# Patient Record
Sex: Female | Born: 1995 | Race: Black or African American | Hispanic: No | Marital: Single | Smoking: Never smoker
Health system: Southern US, Community
[De-identification: ages and names within clinical notes are randomized; demographics above are authoritative.]

## PROBLEM LIST (undated history)

## (undated) DIAGNOSIS — Z87442 Personal history of urinary calculi: Secondary | ICD-10-CM

## (undated) DIAGNOSIS — R102 Pelvic and perineal pain: Secondary | ICD-10-CM

## (undated) DIAGNOSIS — Z8489 Family history of other specified conditions: Secondary | ICD-10-CM

## (undated) DIAGNOSIS — N200 Calculus of kidney: Secondary | ICD-10-CM

---
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
## 2015-01-20 ENCOUNTER — Emergency Department (HOSPITAL_COMMUNITY): Payer: BLUE CROSS/BLUE SHIELD

## 2015-01-20 ENCOUNTER — Encounter (HOSPITAL_COMMUNITY): Payer: Self-pay | Admitting: Emergency Medicine

## 2015-01-20 ENCOUNTER — Ambulatory Visit: Payer: Self-pay | Admitting: Physician Assistant

## 2015-01-20 ENCOUNTER — Emergency Department (HOSPITAL_COMMUNITY)
Admission: EM | Admit: 2015-01-20 | Discharge: 2015-01-20 | Disposition: A | Discharge status: Home / Self Care | Payer: BLUE CROSS/BLUE SHIELD | Attending: Emergency Medicine | Admitting: Emergency Medicine

## 2015-01-20 DIAGNOSIS — N390 Urinary tract infection, site not specified: Secondary | ICD-10-CM | POA: Diagnosis not present

## 2015-01-20 DIAGNOSIS — Z3202 Encounter for pregnancy test, result negative: Secondary | ICD-10-CM | POA: Insufficient documentation

## 2015-01-20 DIAGNOSIS — Z79899 Other long term (current) drug therapy: Secondary | ICD-10-CM | POA: Insufficient documentation

## 2015-01-20 DIAGNOSIS — Z792 Long term (current) use of antibiotics: Secondary | ICD-10-CM | POA: Insufficient documentation

## 2015-01-20 DIAGNOSIS — Z880 Allergy status to penicillin: Secondary | ICD-10-CM | POA: Insufficient documentation

## 2015-01-20 DIAGNOSIS — R1012 Left upper quadrant pain: Secondary | ICD-10-CM | POA: Diagnosis present

## 2015-01-20 DIAGNOSIS — Z8742 Personal history of other diseases of the female genital tract: Secondary | ICD-10-CM | POA: Diagnosis not present

## 2015-01-20 LAB — URINALYSIS, ROUTINE W REFLEX MICROSCOPIC
Bilirubin Urine: NEGATIVE
GLUCOSE, UA: NEGATIVE mg/dL
Hgb urine dipstick: NEGATIVE
Ketones, ur: NEGATIVE mg/dL
Nitrite: POSITIVE — AB
PH: 6.5 (ref 5.0–8.0)
Protein, ur: NEGATIVE mg/dL
Specific Gravity, Urine: 1.024 (ref 1.005–1.030)

## 2015-01-20 LAB — CBC WITH DIFFERENTIAL/PLATELET
BASOS ABS: 0 10*3/uL (ref 0.0–0.1)
Basophils Relative: 0 %
EOS PCT: 1 %
Eosinophils Absolute: 0.1 10*3/uL (ref 0.0–0.7)
HCT: 38.6 % (ref 36.0–46.0)
Hemoglobin: 13 g/dL (ref 12.0–15.0)
LYMPHS PCT: 30 %
Lymphs Abs: 2.3 10*3/uL (ref 0.7–4.0)
MCH: 31.2 pg (ref 26.0–34.0)
MCHC: 33.7 g/dL (ref 30.0–36.0)
MCV: 92.6 fL (ref 78.0–100.0)
MONO ABS: 0.7 10*3/uL (ref 0.1–1.0)
MONOS PCT: 9 %
Neutro Abs: 4.5 10*3/uL (ref 1.7–7.7)
Neutrophils Relative %: 60 %
PLATELETS: 222 10*3/uL (ref 150–400)
RBC: 4.17 MIL/uL (ref 3.87–5.11)
RDW: 12 % (ref 11.5–15.5)
WBC: 7.5 10*3/uL (ref 4.0–10.5)

## 2015-01-20 LAB — COMPREHENSIVE METABOLIC PANEL
ALBUMIN: 4.1 g/dL (ref 3.5–5.0)
ALK PHOS: 62 U/L (ref 38–126)
ALT: 14 U/L (ref 14–54)
AST: 19 U/L (ref 15–41)
Anion gap: 7 (ref 5–15)
BILIRUBIN TOTAL: 0.5 mg/dL (ref 0.3–1.2)
BUN: 8 mg/dL (ref 6–20)
CALCIUM: 9.8 mg/dL (ref 8.9–10.3)
CO2: 26 mmol/L (ref 22–32)
Chloride: 104 mmol/L (ref 101–111)
Creatinine, Ser: 0.76 mg/dL (ref 0.44–1.00)
GFR calc Af Amer: >60 mL/min (ref >60)
GFR calc non Af Amer: >60 mL/min (ref >60)
GLUCOSE: 85 mg/dL (ref 65–99)
POTASSIUM: 4.4 mmol/L (ref 3.5–5.1)
Sodium: 137 mmol/L (ref 135–145)
TOTAL PROTEIN: 7.9 g/dL (ref 6.5–8.1)

## 2015-01-20 LAB — URINE MICROSCOPIC-ADD ON: RBC / HPF: NONE SEEN RBC/hpf (ref 0–5)

## 2015-01-20 LAB — PREGNANCY, URINE: Preg Test, Ur: NEGATIVE

## 2015-01-20 LAB — LIPASE, BLOOD: Lipase: 26 U/L (ref 11–51)

## 2015-01-20 MED ORDER — FLUCONAZOLE 200 MG PO TABS: 200.0000 mg | ORAL_TABLET | Freq: Every day | ORAL | Status: DC

## 2015-01-20 MED ORDER — CIPROFLOXACIN HCL 500 MG PO TABS: 500.0000 mg | ORAL_TABLET | Freq: Two times a day (BID) | ORAL | Status: DC

## 2015-01-20 NOTE — ED Notes (Signed)
PT DISCHARGED. INSTRUCTIONS AND PRESCRIPTION GIVEN. AAOX3. PT IN NO APPARENT DISTRESS. THE OPPORTUNITY TO ASK QUESTIONS WAS PROVIDED. 

## 2015-01-20 NOTE — Discharge Instructions (Signed)

## 2015-01-20 NOTE — ED Notes (Addendum)
Pt reports intermittent left lower abdominal pain and left lower flank pain intermittently for 1.5 months. Says she noticed her urine was cloudy today. Was recently diagnosed with bacterial vaginosis at her [facility] and is currently taking antibiotics for this. Also had urine tested this same visit and was told she did not have a UTI. Denies dysuria/burning with urination/N/V/D/Fevers. Taking Flagyl currently. No other c/c.

## 2015-01-20 NOTE — ED Notes (Signed)
INITIAL ASSESSMENT COMPLETED. PT C/O LL BACK PAIN RADIATING TO LLQ X1 MONTH. PT DENIES URINARY SYMPTOMS. PT STATES SHE WAS SEEN BY HER PMD, AND AND WAS TOLD HER LEFT OVARY TENDERNESS.  AWAITING FURTHER ORDERS.

## 2015-01-20 NOTE — ED Provider Notes (Signed)
CSN: [id number]     Arrival date & time 01/20/15  1423 History   First MD Initiated Contact with Patient 01/20/15 1543     Chief Complaint  Patient presents with  . Flank Pain  . Abdominal Pain  . Cloudy urine      (Consider location/radiation/quality/duration/timing/severity/associated sxs/prior Treatment) HPI Comments: Patient presents with abdominal pain. She states over last 1-2 months she's had intermittent pain in her left upper abdomen. Occasionally it's on her right upper abdomen. She has no associated nausea or vomiting. She denies any other radicular symptoms. Occasionally it's in her back but it doesn't seem to radiate around to her back. She denies any lower abdominal pain. She denies any urinary symptoms. There is no vaginal bleeding or discharge although she was recently treated for bacterial vaginosis with Flagyl. She denies any change in bowel habits although she is occasionally constipated. She denies any fevers or chills. She's not been taking anything for the symptoms.  Patient is a 19 y.o. female presenting with flank pain and abdominal pain.  Flank Pain Associated symptoms include abdominal pain. Pertinent negatives include no chest pain, no headaches and no shortness of breath.  Abdominal Pain Associated symptoms: no chest pain, no chills, no cough, no diarrhea, no fatigue, no fever, no hematuria, no nausea, no shortness of breath and no vomiting     Past Medical History  Diagnosis Date  . Bacterial vaginosis    History reviewed. No pertinent past surgical history. History reviewed. No pertinent family history. Social History  Substance Use Topics  . Smoking status: Never Smoker   . Smokeless tobacco: None  . Alcohol Use: No   OB History    No data available     Review of Systems  Constitutional: Negative for fever, chills, diaphoresis and fatigue.  HENT: Negative for congestion, rhinorrhea and sneezing.   Eyes: Negative.   Respiratory: Negative for  cough, chest tightness and shortness of breath.   Cardiovascular: Negative for chest pain and leg swelling.  Gastrointestinal: Positive for abdominal pain. Negative for nausea, vomiting, diarrhea and blood in stool.  Genitourinary: Negative for frequency, hematuria, flank pain and difficulty urinating.  Musculoskeletal: Negative for back pain and arthralgias.  Skin: Negative for rash.  Neurological: Negative for dizziness, speech difficulty, weakness, numbness and headaches.      Allergies  Penicillins  Home Medications   Prior to Admission medications   Medication Sig Start Date End Date Taking? Authorizing Provider  ciprofloxacin (CIPRO) 500 MG tablet Take 1 tablet (500 mg total) by mouth 2 (two) times daily. One po bid x 7 days 01/20/15   Rolan Bucco, MD  metroNIDAZOLE (FLAGYL) 500 MG tablet Take 500 mg by mouth 2 (two) times daily.   Yes Historical Provider, MD  Multiple Vitamin (MULTIVITAMIN WITH MINERALS) TABS tablet Take 1 tablet by mouth daily.   Yes Historical Provider, MD  Pseudoeph-Doxylamine-DM-APAP (DAYQUIL/NYQUIL COLD/FLU RELIEF PO) Take 1 capsule by mouth daily as needed (cold symptoms).   Yes Historical Provider, MD   BP 107/68 mmHg  Pulse 77  Temp(Src) 97.4 F (36.3 C) (Oral)  Resp 18  Ht  (1.473 m)  Wt 126 lb (57.153 kg)  BMI 26.34 kg/m2  SpO2 98% Physical Exam  Constitutional: She is oriented to person, place, and time. She appears well-developed and well-nourished.  HENT:  Head: Normocephalic and atraumatic.  Eyes: Pupils are equal, round, and reactive to light.  Neck: Normal range of motion. Neck supple.  Cardiovascular: Normal rate, regular rhythm  and normal heart sounds.   Pulmonary/Chest: Effort normal and breath sounds normal. No respiratory distress. She has no wheezes. She has no rales. She exhibits no tenderness.  Abdominal: Soft. Bowel sounds are normal. There is tenderness (Mild tenderness to left upper quadrant). There is no rebound and no  guarding.  Musculoskeletal: Normal range of motion. She exhibits no edema.  Lymphadenopathy:    She has no cervical adenopathy.  Neurological: She is alert and oriented to person, place, and time.  Skin: Skin is warm and dry. No rash noted.  Psychiatric: She has a normal mood and affect.    ED Course  Procedures (including critical care time) Labs Review Results for orders placed or performed during the hospital encounter of 01/20/15  Urinalysis, Routine w reflex microscopic  Result Value Ref Range   Color, Urine RED (A) YELLOW   APPearance CLOUDY (A) CLEAR   Specific Gravity, Urine 1.024 1.005 - 1.030   pH 6.5 5.0 - 8.0   Glucose, UA NEGATIVE NEGATIVE mg/dL   Hgb urine dipstick NEGATIVE NEGATIVE   Bilirubin Urine NEGATIVE NEGATIVE   Ketones, ur NEGATIVE NEGATIVE mg/dL   Protein, ur NEGATIVE NEGATIVE mg/dL   Nitrite POSITIVE (A) NEGATIVE   Leukocytes, UA MODERATE (A) NEGATIVE  Pregnancy, urine  Result Value Ref Range   Preg Test, Ur NEGATIVE NEGATIVE  Comprehensive metabolic panel  Result Value Ref Range   Sodium 137 135 - 145 mmol/L   Potassium 4.4 3.5 - 5.1 mmol/L   Chloride 104 101 - 111 mmol/L   CO2 26 22 - 32 mmol/L   Glucose, Bld 85 65 - 99 mg/dL   BUN 8 6 - 20 mg/dL   Creatinine, Ser 1.61 0.44 - 1.00 mg/dL   Calcium 9.8 8.9 - 09.6 mg/dL   Total Protein 7.9 6.5 - 8.1 g/dL   Albumin 4.1 3.5 - 5.0 g/dL   AST 19 15 - 41 U/L   ALT 14 14 - 54 U/L   Alkaline Phosphatase 62 38 - 126 U/L   Total Bilirubin 0.5 0.3 - 1.2 mg/dL   GFR calc non Af Amer >60 >60 mL/min   GFR calc Af Amer >60 >60 mL/min   Anion gap 7 5 - 15  CBC with Differential  Result Value Ref Range   WBC 7.5 4.0 - 10.5 K/uL   RBC 4.17 3.87 - 5.11 MIL/uL   Hemoglobin 13.0 12.0 - 15.0 g/dL   HCT 04.5 40.9 - 81.1 %   MCV 92.6 78.0 - 100.0 fL   MCH 31.2 26.0 - 34.0 pg   MCHC 33.7 30.0 - 36.0 g/dL   RDW 91.4 78.2 - 95.6 %   Platelets 222 150 - 400 K/uL   Neutrophils Relative % 60 %   Neutro Abs 4.5  1.7 - 7.7 K/uL   Lymphocytes Relative 30 %   Lymphs Abs 2.3 0.7 - 4.0 K/uL   Monocytes Relative 9 %   Monocytes Absolute 0.7 0.1 - 1.0 K/uL   Eosinophils Relative 1 %   Eosinophils Absolute 0.1 0.0 - 0.7 K/uL   Basophils Relative 0 %   Basophils Absolute 0.0 0.0 - 0.1 K/uL  Lipase, blood  Result Value Ref Range   Lipase 26 11 - 51 U/L  Urine microscopic-add on  Result Value Ref Range   Squamous Epithelial / LPF 6-30 (A) NONE SEEN   WBC, UA 6-30 0 - 5 WBC/hpf   RBC / HPF NONE SEEN 0 - 5 RBC/hpf   Bacteria, UA  FEW (A) NONE SEEN   Urine-Other MUCOUS PRESENT    Dg Abd Acute W/chest  01/20/2015  CLINICAL DATA:  Left abdominal and flank pain for 1.5 months. Bacterial vaginosis. EXAM: DG ABDOMEN ACUTE W/ 1V CHEST COMPARISON:  None. FINDINGS: There is no evidence of dilated bowel loops or free intraperitoneal air. No radiopaque calculi identified. Moderate colonic stool noted. Heart size and mediastinal contours are within normal limits. Both lungs are clear. No evidence of pneumothorax or pleural effusion. IMPRESSION: No acute findings.  Moderate stool noted. No active cardiopulmonary disease. Electronically Signed   By: Myles Rosenthal M.D.   On: 01/20/2015 17:35   Ct Renal Stone Study  01/20/2015  CLINICAL DATA:  Left lower quadrant pain and left flank pain for 6 weeks. EXAM: CT ABDOMEN AND PELVIS WITHOUT CONTRAST TECHNIQUE: Multidetector CT imaging of the abdomen and pelvis was performed following the standard protocol without IV contrast. COMPARISON:  None. FINDINGS: Lower chest:  No acute findings. Hepatobiliary: No mass visualized on this un-enhanced exam. Gallbladder is unremarkable. Pancreas: No mass or inflammatory process identified on this un-enhanced exam. Spleen: Within normal limits in size. Adrenals/Urinary Tract: A 3 mm nonobstructive calculus seen in midpole of right kidney. No evidence of hydronephrosis. No evidence of ureteral calculi or dilatation. No bladder calculi visualized.  Stomach/Bowel: No evidence of obstruction, inflammatory process, or abnormal fluid collections. Normal appendix visualized. Vascular/Lymphatic: No pathologically enlarged lymph nodes. No evidence of abdominal aortic aneurysm. Reproductive: No mass or other significant abnormality. Other: None. Musculoskeletal:  No suspicious bone lesions identified. IMPRESSION: 3 mm nonobstructive right renal calculus. No evidence of ureteral calculi, hydronephrosis, or other acute findings. Electronically Signed   By: Myles Rosenthal M.D.   On: 01/20/2015 19:21      Imaging Review Dg Abd Acute W/chest  01/20/2015  CLINICAL DATA:  Left abdominal and flank pain for 1.5 months. Bacterial vaginosis. EXAM: DG ABDOMEN ACUTE W/ 1V CHEST COMPARISON:  None. FINDINGS: There is no evidence of dilated bowel loops or free intraperitoneal air. No radiopaque calculi identified. Moderate colonic stool noted. Heart size and mediastinal contours are within normal limits. Both lungs are clear. No evidence of pneumothorax or pleural effusion. IMPRESSION: No acute findings.  Moderate stool noted. No active cardiopulmonary disease. Electronically Signed   By: Myles Rosenthal M.D.   On: 01/20/2015 17:35   Ct Renal Stone Study  01/20/2015  CLINICAL DATA:  Left lower quadrant pain and left flank pain for 6 weeks. EXAM: CT ABDOMEN AND PELVIS WITHOUT CONTRAST TECHNIQUE: Multidetector CT imaging of the abdomen and pelvis was performed following the standard protocol without IV contrast. COMPARISON:  None. FINDINGS: Lower chest:  No acute findings. Hepatobiliary: No mass visualized on this un-enhanced exam. Gallbladder is unremarkable. Pancreas: No mass or inflammatory process identified on this un-enhanced exam. Spleen: Within normal limits in size. Adrenals/Urinary Tract: A 3 mm nonobstructive calculus seen in midpole of right kidney. No evidence of hydronephrosis. No evidence of ureteral calculi or dilatation. No bladder calculi visualized. Stomach/Bowel:  No evidence of obstruction, inflammatory process, or abnormal fluid collections. Normal appendix visualized. Vascular/Lymphatic: No pathologically enlarged lymph nodes. No evidence of abdominal aortic aneurysm. Reproductive: No mass or other significant abnormality. Other: None. Musculoskeletal:  No suspicious bone lesions identified. IMPRESSION: 3 mm nonobstructive right renal calculus. No evidence of ureteral calculi, hydronephrosis, or other acute findings. Electronically Signed   By: Myles Rosenthal M.D.   On: 01/20/2015 19:21   I have personally reviewed and evaluated these images and  lab results as part of my medical decision-making.   EKG Interpretation None      MDM   Final diagnoses:  UTI (lower urinary tract infection)    Patient presents with left-sided abdominal pain. It's more in the upper abdomen. There is no pelvic tenderness. She had a recent pelvic exams-1 was indicated today given the location of her symptoms. She has evidence of a UTI. There was some hematuria so I did do a CT scan to rule out an obstructing stone. There is no evidence of an obstructing kidney stone. No other significant abnormality was noted. Patient is nontoxic appearing. There is no signs of systemic illness. She was discharged home in good condition with prescriptions for Cipro. She was given return precautions. She was also advised to use MiraLAX at home for the constipation.    Rolan BuccoMelanie Nailah Luepke, MD 01/20/15 2000

## 2015-01-22 LAB — URINE CULTURE: Special Requests: NORMAL

## 2015-02-28 ENCOUNTER — Encounter: Payer: Self-pay | Admitting: Obstetrics and Gynecology

## 2015-02-28 ENCOUNTER — Ambulatory Visit (INDEPENDENT_AMBULATORY_CARE_PROVIDER_SITE_OTHER): Payer: BLUE CROSS/BLUE SHIELD | Admitting: Obstetrics and Gynecology

## 2015-02-28 VITALS — BP 111/66 | HR 80 | Temp 99.3°F | Wt 122.0 lb

## 2015-02-28 DIAGNOSIS — N390 Urinary tract infection, site not specified: Secondary | ICD-10-CM

## 2015-02-28 NOTE — Patient Instructions (Signed)
Contraception Choices Contraception (birth control) is the use of any methods or devices to prevent pregnancy. Below are some methods to help avoid pregnancy. HORMONAL METHODS   Contraceptive implant. This is a thin, plastic tube containing progesterone hormone. It does not contain estrogen hormone. Your health care provider inserts the tube in the inner part of the upper arm. The tube can remain in place for up to 3 years. After 3 years, the implant must be removed. The implant prevents the ovaries from releasing an egg (ovulation), thickens the cervical mucus to prevent sperm from entering the uterus, and thins the lining of the inside of the uterus.  Progesterone-only injections. These injections are given every 3 months by your health care provider to prevent pregnancy. This synthetic progesterone hormone stops the ovaries from releasing eggs. It also thickens cervical mucus and changes the uterine lining. This makes it harder for sperm to survive in the uterus.  Birth control pills. These pills contain estrogen and progesterone hormone. They work by preventing the ovaries from releasing eggs (ovulation). They also cause the cervical mucus to thicken, preventing the sperm from entering the uterus. Birth control pills are prescribed by a health care provider.Birth control pills can also be used to treat heavy periods.  Minipill. This type of birth control pill contains only the progesterone hormone. They are taken every day of each month and must be prescribed by your health care provider.  Birth control patch. The patch contains hormones similar to those in birth control pills. It must be changed once a week and is prescribed by a health care provider.  Vaginal ring. The ring contains hormones similar to those in birth control pills. It is left in the vagina for 3 weeks, removed for 1 week, and then a new one is put back in place. The patient must be comfortable inserting and removing the ring  from the vagina.A health care provider's prescription is necessary.  Emergency contraception. Emergency contraceptives prevent pregnancy after unprotected sexual intercourse. This pill can be taken right after sex or up to 5 days after unprotected sex. It is most effective the sooner you take the pills after having sexual intercourse. Most emergency contraceptive pills are available without a prescription. Check with your pharmacist. Do not use emergency contraception as your only form of birth control. BARRIER METHODS   Female condom. This is a thin sheath (latex or rubber) that is worn over the penis during sexual intercourse. It can be used with spermicide to increase effectiveness.  Female condom. This is a soft, loose-fitting sheath that is put into the vagina before sexual intercourse.  Diaphragm. This is a soft, latex, dome-shaped barrier that must be fitted by a health care provider. It is inserted into the vagina, along with a spermicidal jelly. It is inserted before intercourse. The diaphragm should be left in the vagina for 6 to 8 hours after intercourse.  Cervical cap. This is a round, soft, latex or plastic cup that fits over the cervix and must be fitted by a health care provider. The cap can be left in place for up to 48 hours after intercourse.  Sponge. This is a soft, circular piece of polyurethane foam. The sponge has spermicide in it. It is inserted into the vagina after wetting it and before sexual intercourse.  Spermicides. These are chemicals that kill or block sperm from entering the cervix and uterus. They come in the form of creams, jellies, suppositories, foam, or tablets. They do not require a   prescription. They are inserted into the vagina with an applicator before having sexual intercourse. The process must be repeated every time you have sexual intercourse. INTRAUTERINE CONTRACEPTION  Intrauterine device (IUD). This is a T-shaped device that is put in a woman's uterus  during a menstrual period to prevent pregnancy. There are 2 types:  Copper IUD. This type of IUD is wrapped in copper wire and is placed inside the uterus. Copper makes the uterus and fallopian tubes produce a fluid that kills sperm. It can stay in place for 10 years.  Hormone IUD. This type of IUD contains the hormone progestin (synthetic progesterone). The hormone thickens the cervical mucus and prevents sperm from entering the uterus, and it also thins the uterine lining to prevent implantation of a fertilized egg. The hormone can weaken or kill the sperm that get into the uterus. It can stay in place for 3-5 years, depending on which type of IUD is used. PERMANENT METHODS OF CONTRACEPTION  Female tubal ligation. This is when the woman's fallopian tubes are surgically sealed, tied, or blocked to prevent the egg from traveling to the uterus.  Hysteroscopic sterilization. This involves placing a small coil or insert into each fallopian tube. Your doctor uses a technique called hysteroscopy to do the procedure. The device causes scar tissue to form. This results in permanent blockage of the fallopian tubes, so the sperm cannot fertilize the egg. It takes about 3 months after the procedure for the tubes to become blocked. You must use another form of birth control for these 3 months.  Female sterilization. This is when the female has the tubes that carry sperm tied off (vasectomy).This blocks sperm from entering the vagina during sexual intercourse. After the procedure, the man can still ejaculate fluid (semen). NATURAL PLANNING METHODS  Natural family planning. This is not having sexual intercourse or using a barrier method (condom, diaphragm, cervical cap) on days the woman could become pregnant.  Calendar method. This is keeping track of the length of each menstrual cycle and identifying when you are fertile.  Ovulation method. This is avoiding sexual intercourse during ovulation.  Symptothermal  method. This is avoiding sexual intercourse during ovulation, using a thermometer and ovulation symptoms.  Post-ovulation method. This is timing sexual intercourse after you have ovulated. Regardless of which type or method of contraception you choose, it is important that you use condoms to protect against the transmission of sexually transmitted infections (STIs). Talk with your health care provider about which form of contraception is most appropriate for you.   This information is not intended to replace advice given to you by your health care provider. Make sure you discuss any questions you have with your health care provider.   Document Released: 02/01/2005 Document Revised: 02/06/2013 Document Reviewed: 07/27/2012 Elsevier Interactive Patient Education 2016 [employer]  

## 2015-02-28 NOTE — Progress Notes (Signed)
Patient ID: Sheri Holden, female   DOB: 1996-01-07, 20 y.o.   MRN: [id number] 20 yo G0 presenting today as an ED follow up for abdominal pain. Patient was diagnosed with a UTI and kidney stone. She reports that her abdominal pain is still present and seems to be aggravated with bowel movements. She denies any abnormal vaginal bleeding or discharge. Her pain is located in the left upper and mid quadrant. Patient does report some constipation. She is not currently sexually active but is using depo-provera for contraception  Past Medical History  Diagnosis Date  . Bacterial vaginosis    No past surgical history on file. No family history on file. Social History  Substance Use Topics  . Smoking status: Never Smoker   . Smokeless tobacco: None  . Alcohol Use: No   ROS See pertinent in HPI  Blood pressure 111/66, pulse 80, temperature 99.3 F (37.4 C), weight 122 lb (55.339 kg). GENERAL: Well-developed, well-nourished female in no acute distress.  ABDOMEN: Soft, nondistended, tenderness in LUQ and left periumbilical region. No rebound, no guarding. PELVIC: Not indicated EXTREMITIES: No cyanosis, clubbing, or edema, 2+ distal pulses.  EXAM: CT ABDOMEN AND PELVIS WITHOUT CONTRAST  TECHNIQUE: Multidetector CT imaging of the abdomen and pelvis was performed following the standard protocol without IV contrast.  COMPARISON: None.  FINDINGS: Lower chest: No acute findings.  Hepatobiliary: No mass visualized on this un-enhanced exam. Gallbladder is unremarkable.  Pancreas: No mass or inflammatory process identified on this un-enhanced exam.  Spleen: Within normal limits in size.  Adrenals/Urinary Tract: A 3 mm nonobstructive calculus seen in midpole of right kidney. No evidence of hydronephrosis. No evidence of ureteral calculi or dilatation. No bladder calculi visualized.  Stomach/Bowel: No evidence of obstruction, inflammatory process, or abnormal fluid collections.  Normal appendix visualized.  Vascular/Lymphatic: No pathologically enlarged lymph nodes. No evidence of abdominal aortic aneurysm.  Reproductive: No mass or other significant abnormality.  Other: None.  Musculoskeletal: No suspicious bone lesions identified.  IMPRESSION: 3 mm nonobstructive right renal calculus. No evidence of ureteral calculi, hydronephrosis, or other acute findings.   Electronically Signed  By: Myles RosenthalJohn Stahl M.D.  On: 01/20/2015 19:21  A/P 20 yo with abdominal pain - Pain likely related to constipation.  - Advised to increase her water and fiber intake (either through diet or supplements) - Recommended starting on probiotics - If abdominal pain persists, she should follow up with a PCP

## 2015-03-01 LAB — URINE CULTURE
COLONY COUNT: NO GROWTH
Organism ID, Bacteria: NO GROWTH

## 2015-05-09 ENCOUNTER — Inpatient Hospital Stay (HOSPITAL_COMMUNITY)
Admission: EM | Admit: 2015-05-09 | Discharge: 2015-05-10 | Disposition: A | Discharge status: Home / Self Care | Payer: BLUE CROSS/BLUE SHIELD | Source: Ambulatory Visit | Attending: Obstetrics & Gynecology | Admitting: Obstetrics & Gynecology

## 2015-05-09 ENCOUNTER — Encounter (HOSPITAL_COMMUNITY): Payer: Self-pay | Admitting: *Deleted

## 2015-05-09 DIAGNOSIS — Z711 Person with feared health complaint in whom no diagnosis is made: Secondary | ICD-10-CM

## 2015-05-09 DIAGNOSIS — Z87442 Personal history of urinary calculi: Secondary | ICD-10-CM | POA: Insufficient documentation

## 2015-05-09 DIAGNOSIS — Z880 Allergy status to penicillin: Secondary | ICD-10-CM | POA: Insufficient documentation

## 2015-05-09 DIAGNOSIS — G8929 Other chronic pain: Secondary | ICD-10-CM

## 2015-05-09 DIAGNOSIS — Z3202 Encounter for pregnancy test, result negative: Secondary | ICD-10-CM | POA: Insufficient documentation

## 2015-05-09 DIAGNOSIS — R1032 Left lower quadrant pain: Secondary | ICD-10-CM | POA: Insufficient documentation

## 2015-05-09 DIAGNOSIS — K5900 Constipation, unspecified: Secondary | ICD-10-CM

## 2015-05-09 LAB — POCT PREGNANCY, URINE: Preg Test, Ur: NEGATIVE

## 2015-05-09 NOTE — MAU Note (Signed)
Having chest pain/pressure mid chest for month and half. Lower back pain and pain LLQ for about a month. Denies irreg vag bleeding or d/c

## 2015-05-09 NOTE — MAU Provider Note (Signed)
History     CSN: [id number]  Arrival date and time: 05/09/15 2257   First Provider Initiated Contact with Patient 05/09/15 2330      Chief Complaint  Patient presents with  . Chest Pain  . Abdominal Pain  . Back Pain   HPI   Ms.Sheri Holden is a 20 y.o. female with a history of constipation G0P0000 presenting to MAU with LLQ pain that started 1.5 month ago. I haven't had time in my schedule to be seen for this. "I have pains all over my body, the pains have been there for over a month". "The pains are in my chest, my abdomen and my lower back".   She voices concern that she may be dehydrated and may need IV fluids today.    She had a BM yesterday, unsure prior to that when the last stool was. She is not taking anything at home for the constipation. This was discussed at her last visit in the [facility].   She has occasional chest pain that comes randomly; when it comes it feels like pressure. When she sits down the pain goes away. She denies pain at this time. No one in her family has heart problems. Patient feels it could be related to stress.    OB History    Gravida Para Term Preterm AB TAB SAB Ectopic Multiple Living        Past Medical History  Diagnosis Date  . Bacterial vaginosis   . Kidney stone     Past Surgical History  Procedure Laterality Date  . No past surgeries      Family History  Problem Relation Age of Onset  . Alcohol abuse Neg Hx     Social History  Substance Use Topics  . Smoking status: Never Smoker   . Smokeless tobacco: None  . Alcohol Use: No    Allergies:  Allergies  Allergen Reactions  . Penicillins     Unknown reaction, childhood allergy     Prescriptions prior to admission  Medication Sig Dispense Refill Last Dose  . Multiple Vitamin (MULTIVITAMIN WITH MINERALS) TABS tablet Take 1 tablet by mouth daily. Reported on 02/28/2015   05/08/2015 at Unknown time  . ciprofloxacin (CIPRO) 500 MG tablet Take 1 tablet  (500 mg total) by mouth 2 (two) times daily. One po bid x 7 days (Patient not taking: Reported on 02/28/2015) 14 tablet 0 Not Taking  . fluconazole (DIFLUCAN) 200 MG tablet Take 1 tablet (200 mg total) by mouth daily. (Patient not taking: Reported on 02/28/2015) 1 tablet 0 Not Taking  . metroNIDAZOLE (FLAGYL) 500 MG tablet Take 500 mg by mouth 2 (two) times daily. Reported on 02/28/2015   Not Taking  . Pseudoeph-Doxylamine-DM-APAP (DAYQUIL/NYQUIL COLD/FLU RELIEF PO) Take 1 capsule by mouth daily as needed (cold symptoms). Reported on 02/28/2015   Not Taking   Results for orders placed or performed during the hospital encounter of 05/09/15 (from the past 48 hour(s))  Urinalysis, Routine w reflex microscopic (not at [facility])     Status: Abnormal   Collection Time: 05/09/15 11:15 PM  Result Value Ref Range   Color, Urine YELLOW YELLOW   APPearance CLEAR CLEAR   Specific Gravity, Urine 1.025 1.005 - 1.030   pH 5.5 5.0 - 8.0   Glucose, UA NEGATIVE NEGATIVE mg/dL   Hgb urine dipstick SMALL (A) NEGATIVE   Bilirubin Urine NEGATIVE NEGATIVE   Ketones, ur NEGATIVE NEGATIVE mg/dL  Protein, ur NEGATIVE NEGATIVE mg/dL   Nitrite NEGATIVE NEGATIVE   Leukocytes, UA NEGATIVE NEGATIVE  Urine microscopic-add on     Status: Abnormal   Collection Time: 05/09/15 11:15 PM  Result Value Ref Range   Squamous Epithelial / LPF 0-5 (A) NONE SEEN   WBC, UA 0-5 0 - 5 WBC/hpf   RBC / HPF 0-5 0 - 5 RBC/hpf   Bacteria, UA RARE (A) NONE SEEN   Urine-Other MUCOUS PRESENT   Pregnancy, urine POC     Status: None   Collection Time: 05/09/15 11:23 PM  Result Value Ref Range   Preg Test, Ur NEGATIVE NEGATIVE    Comment:        THE SENSITIVITY OF THIS METHODOLOGY IS >24 mIU/mL     Review of Systems  Constitutional: Negative for fever.  Cardiovascular: Negative for chest pain (It comes and goes. None now. ).  Gastrointestinal: Positive for constipation. Negative for nausea, vomiting, abdominal pain and diarrhea.   Genitourinary: Negative for dysuria.       Denies abnormal discharge.    Physical Exam   Blood pressure 98/60, pulse 79, temperature 99.7 F (37.6 C), resp. rate 18, height 4\' 10"  (1.473 m), weight 123 lb 6.4 oz (55.974 kg), SpO2 100 %.  Physical Exam  Constitutional: She is oriented to person, place, and time. She appears well-developed and well-nourished. No distress.  HENT:  Head: Normocephalic.  Eyes: Pupils are equal, round, and reactive to light.  Neck: Neck supple.  Cardiovascular: Normal rate and normal heart sounds.   Respiratory: Effort normal and breath sounds normal.  GI: Soft. She exhibits no distension. There is tenderness (Tenderness in the LLQ with deep palpation. ). There is no rebound and no guarding.  Musculoskeletal: Normal range of motion.  Neurological: She is alert and oriented to person, place, and time.  Skin: Skin is warm. She is not diaphoretic.  Psychiatric: Her behavior is normal.    MAU Course  Procedures  None  MDM   Assessment and Plan   A:  1. Chronic left lower quadrant pain   2. Constipation, unspecified constipation type   3. Physically well but worried     P:  Discharge home in stable condition Discussed at home remedy for constipation including miralax/metamucil/ colace. If chest pain returns and does not subside and is accompanied with shortness of breath call 911 or [facility] Discussed the importance of a PCP   Duane LopeJennifer I Rasch, NP 05/10/2015 4:44 AM

## 2015-05-10 DIAGNOSIS — G8929 Other chronic pain: Secondary | ICD-10-CM

## 2015-05-10 DIAGNOSIS — R1031 Right lower quadrant pain: Secondary | ICD-10-CM

## 2015-05-10 LAB — URINALYSIS, ROUTINE W REFLEX MICROSCOPIC
Bilirubin Urine: NEGATIVE
GLUCOSE, UA: NEGATIVE mg/dL
KETONES UR: NEGATIVE mg/dL
LEUKOCYTES UA: NEGATIVE
Nitrite: NEGATIVE
PH: 5.5 (ref 5.0–8.0)
Protein, ur: NEGATIVE mg/dL
Specific Gravity, Urine: 1.025 (ref 1.005–1.030)

## 2015-05-10 LAB — URINE MICROSCOPIC-ADD ON

## 2015-05-10 NOTE — Discharge Instructions (Signed)

## 2015-05-10 NOTE — Progress Notes (Signed)
Jennifer Rasch NP in earlier to discuss test results and d/c plan. Written and verbal d/c instructions given and understanding voiced. 

## 2016-10-10 ENCOUNTER — Encounter (HOSPITAL_COMMUNITY): Payer: Self-pay | Admitting: Emergency Medicine

## 2016-10-10 DIAGNOSIS — Z87442 Personal history of urinary calculi: Secondary | ICD-10-CM | POA: Diagnosis not present

## 2016-10-10 DIAGNOSIS — R1012 Left upper quadrant pain: Secondary | ICD-10-CM | POA: Insufficient documentation

## 2016-10-10 DIAGNOSIS — N200 Calculus of kidney: Secondary | ICD-10-CM | POA: Diagnosis not present

## 2016-10-10 DIAGNOSIS — R1011 Right upper quadrant pain: Secondary | ICD-10-CM | POA: Insufficient documentation

## 2016-10-10 DIAGNOSIS — M545 Low back pain: Secondary | ICD-10-CM | POA: Diagnosis present

## 2016-10-10 LAB — COMPREHENSIVE METABOLIC PANEL
ALBUMIN: 3.9 g/dL (ref 3.5–5.0)
ALK PHOS: 62 U/L (ref 38–126)
ALT: 15 U/L (ref 14–54)
ANION GAP: 6 (ref 5–15)
AST: 18 U/L (ref 15–41)
BUN: 10 mg/dL (ref 6–20)
CO2: 28 mmol/L (ref 22–32)
Calcium: 9.8 mg/dL (ref 8.9–10.3)
Chloride: 105 mmol/L (ref 101–111)
Creatinine, Ser: 0.73 mg/dL (ref 0.44–1.00)
GFR calc Af Amer: >60 mL/min (ref >60)
GFR calc non Af Amer: >60 mL/min (ref >60)
GLUCOSE: 103 mg/dL — AB (ref 65–99)
POTASSIUM: 3.8 mmol/L (ref 3.5–5.1)
SODIUM: 139 mmol/L (ref 135–145)
Total Bilirubin: 0.6 mg/dL (ref 0.3–1.2)
Total Protein: 8.4 g/dL — ABNORMAL HIGH (ref 6.5–8.1)

## 2016-10-10 LAB — URINALYSIS, ROUTINE W REFLEX MICROSCOPIC
BILIRUBIN URINE: NEGATIVE
GLUCOSE, UA: NEGATIVE mg/dL
Hgb urine dipstick: NEGATIVE
Ketones, ur: NEGATIVE mg/dL
Leukocytes, UA: NEGATIVE
Nitrite: NEGATIVE
Protein, ur: NEGATIVE mg/dL
pH: 6 (ref 5.0–8.0)

## 2016-10-10 LAB — CBC
HCT: 36.9 % (ref 36.0–46.0)
HEMOGLOBIN: 12.3 g/dL (ref 12.0–15.0)
MCH: 31.1 pg (ref 26.0–34.0)
MCHC: 33.3 g/dL (ref 30.0–36.0)
MCV: 93.2 fL (ref 78.0–100.0)
Platelets: 259 10*3/uL (ref 150–400)
RBC: 3.96 MIL/uL (ref 3.87–5.11)
RDW: 12.4 % (ref 11.5–15.5)
WBC: 10.2 10*3/uL (ref 4.0–10.5)

## 2016-10-10 LAB — LIPASE, BLOOD: Lipase: 25 U/L (ref 11–51)

## 2016-10-10 LAB — POC URINE PREG, ED: Preg Test, Ur: NEGATIVE

## 2016-10-10 NOTE — ED Triage Notes (Signed)
Pt reports that she has been having ongoing UTI symptoms and has been seen by PCP and has not had any improvement in the last month with symptoms. Pt states that she is concerned due to increasing abd pain and burning in back.

## 2016-10-10 NOTE — ED Notes (Addendum)
Pt c/o lower back pain and abd pain onset about 1.5 months ago. Pt's last regular bowel movement was two days ago. Hx of multiple UTI's and kidney stones.

## 2016-10-11 ENCOUNTER — Emergency Department (HOSPITAL_COMMUNITY): Payer: BLUE CROSS/BLUE SHIELD

## 2016-10-11 ENCOUNTER — Emergency Department (HOSPITAL_COMMUNITY)
Admission: EM | Admit: 2016-10-11 | Discharge: 2016-10-11 | Disposition: A | Discharge status: Home / Self Care | Payer: BLUE CROSS/BLUE SHIELD | Attending: Emergency Medicine | Admitting: Emergency Medicine

## 2016-10-11 ENCOUNTER — Encounter (HOSPITAL_COMMUNITY): Payer: Self-pay | Admitting: Radiology

## 2016-10-11 DIAGNOSIS — M545 Low back pain, unspecified: Secondary | ICD-10-CM

## 2016-10-11 DIAGNOSIS — N200 Calculus of kidney: Secondary | ICD-10-CM

## 2016-10-11 NOTE — ED Provider Notes (Signed)
[facility]-EMERGENCY DEPT Provider Note   CSN: [id number] Arrival date & time: 10/10/16  1924     History   Chief Complaint Chief Complaint  Patient presents with  . Abdominal Pain  . Back Pain    HPI Sheri Holden is a 21 y.o. female with a hx of Bacterial vaginosis, kidney stone presents to the Emergency Department complaining of gradual, persistent, low back and bilateral side pain onset 1.5 months ago.  Patient reports that the pain waxes and wanes. She reports it is sharp and sometimes cramping in nature. She denies nausea or vomiting, dysuria, hematuria, vaginal discharge. Nothing seems to make her symptoms better or worse. She reports that 3 weeks ago she was treated for urinary tract infection without relief of her pain. She reports one year ago she had a kidney stone and symptoms felt similar. She denies urinary urgency, urinary hesitancy. She has no additional associated symptoms. Nothing seems to make her symptoms better or worse. She reports that she presents to the emergency department tonight because her grandmother told her she should get checked out.   The history is provided by the patient and medical records. No language interpreter was used.    Past Medical History:  Diagnosis Date  . Bacterial vaginosis   . Kidney stone     There are no active problems to display for this patient.   Past Surgical History:  Procedure Laterality Date  . NO PAST SURGERIES      OB History    Gravida Para Term Preterm AB Living   0 0 0 0 0 0   SAB TAB Ectopic Multiple Live Births   0 0 0 0         Home Medications    Prior to Admission medications   Not on File    Family History Family History  Problem Relation Age of Onset  . Alcohol abuse Neg Hx     Social History Social History  Substance Use Topics  . Smoking status: Never Smoker  . Smokeless tobacco: Never Used  . Alcohol use No     Allergies   Penicillins   Review of Systems Review of Systems    Constitutional: Negative for appetite change, diaphoresis, fatigue, fever and unexpected weight change.  HENT: Negative for mouth sores.   Eyes: Negative for visual disturbance.  Respiratory: Negative for cough, chest tightness, shortness of breath and wheezing.   Cardiovascular: Negative for chest pain.  Gastrointestinal: Positive for abdominal pain. Negative for constipation, diarrhea, nausea and vomiting.  Endocrine: Negative for polydipsia, polyphagia and polyuria.  Genitourinary: Positive for flank pain ( Bilateral). Negative for dysuria, frequency, hematuria and urgency.  Musculoskeletal: Positive for back pain. Negative for neck stiffness.  Skin: Negative for rash.  Allergic/Immunologic: Negative for immunocompromised state.  Neurological: Negative for syncope, light-headedness and headaches.  Hematological: Does not bruise/bleed easily.  Psychiatric/Behavioral: Negative for sleep disturbance. The patient is not nervous/anxious.   All other systems reviewed and are negative.    Physical Exam Updated Vital Signs BP 111/71 (BP Location: Left Arm)   Pulse 78   Temp 98.9 F (37.2 C) (Oral)   Resp 18   Ht 4\' 10"  (1.473 m)   Wt 56 kg (123 lb 8 oz)   LMP 09/09/2016 (Approximate) Comment: neg preg test  SpO2 100%   BMI 25.81 kg/m   Physical Exam  Constitutional: She appears well-developed and well-nourished. No distress.  Awake, alert, nontoxic appearance  HENT:  Head: Normocephalic and atraumatic.  Mouth/Throat: Oropharynx is clear and moist. No oropharyngeal exudate.  Eyes: Conjunctivae are normal. No scleral icterus.  Neck: Normal range of motion. Neck supple.  Full ROM without pain  Cardiovascular: Normal rate, regular rhythm and intact distal pulses.   Pulmonary/Chest: Effort normal and breath sounds normal. No respiratory distress. She has no wheezes.  Equal chest expansion  Abdominal: Soft. Bowel sounds are normal. She exhibits no distension and no mass. There is  no hepatosplenomegaly. There is no tenderness. There is no rigidity, no rebound, no guarding, no CVA tenderness, no tenderness at McBurney's point and negative Murphy's sign.  Musculoskeletal: Normal range of motion. She exhibits no edema.  Full range of motion of the T-spine and L-spine No midline tenderness to the  T-spine or L-spine No tenderness to palpation of the paraspinous muscles of the T-spine or L-spine  Lymphadenopathy:    She has no cervical adenopathy.  Neurological: She is alert.  Speech is clear and goal oriented, follows commands Normal 5/5 strength in upper and lower extremities bilaterally including dorsiflexion and plantar flexion, strong and equal grip strength Sensation normal to light and sharp touch Moves extremities without ataxia, coordination intact Normal gait Normal balance No Clonus  Skin: Skin is warm and dry. No rash noted. She is not diaphoretic. No erythema.  Psychiatric: She has a normal mood and affect. Her behavior is normal.  Nursing note and vitals reviewed.    ED Treatments / Results  Labs (all labs ordered are listed, but only abnormal results are displayed) Labs Reviewed  COMPREHENSIVE METABOLIC PANEL - Abnormal; Notable for the following:       Result Value   Glucose, Bld 103 (*)    Total Protein 8.4 (*)    All other components within normal limits  URINALYSIS, ROUTINE W REFLEX MICROSCOPIC - Abnormal; Notable for the following:    Specific Gravity, Urine >1.030 (*)    All other components within normal limits  LIPASE, BLOOD  CBC  POC URINE PREG, ED    Radiology Ct Renal Stone Study  Result Date: 10/11/2016 CLINICAL DATA:  Lower abdominal pain blood in the urine EXAM: CT ABDOMEN AND PELVIS WITHOUT CONTRAST TECHNIQUE: Multidetector CT imaging of the abdomen and pelvis was performed following the standard protocol without IV contrast. COMPARISON:  01/20/2015 FINDINGS: Lower chest: No acute abnormality. Hepatobiliary: No focal liver  abnormality is seen. No gallstones, gallbladder wall thickening, or biliary dilatation. Pancreas: Unremarkable. No pancreatic ductal dilatation or surrounding inflammatory changes. Spleen: Normal in size without focal abnormality. Adrenals/Urinary Tract: Adrenal glands are within normal limits. No hydronephrosis. Multiple small stones in the right kidney, the largest measures 3 mm and is visualize within the mid to lower pole. Negative for ureteral stone. Bladder within normal limits Stomach/Bowel: Stomach is within normal limits. Appendix appears normal. No evidence of bowel wall thickening, distention, or inflammatory changes. Vascular/Lymphatic: No significant vascular findings are present. No enlarged abdominal or pelvic lymph nodes. Reproductive: Uterus and bilateral adnexa are unremarkable. Other: Negative for free air or free fluid. Musculoskeletal: No acute or significant osseous findings. IMPRESSION: 1. Multiple small stones in the right kidney but no evidence for hydronephrosis or ureteral stones. 2. Negative appendix Electronically Signed   By: Jasmine Pang M.D.   On: 10/11/2016 03:06    Procedures Procedures (including critical care time)  Medications Ordered in ED Medications - No data to display   Initial Impression / Assessment and Plan / ED Course  I have reviewed the triage vital signs and  the nursing notes.  Pertinent labs & imaging results that were available during my care of the patient were reviewed by me and considered in my medical decision making (see chart for details).     Patient with reassuring physical exam and normal lab work. Slightly elevated specific gravity of her urine but no evidence of urinary tract infection. She denies all vaginal symptoms. No CVA tenderness or abdominal tenderness. No clinical evidence of cauda equina. Patient denies numbness, tingling, loss of bowel or bladder control. No hematuria. Pregnancy test is negative.  Discussed findings with  patient and further outpatient workup for her pain. She states she is concerned about kidney stones, appendicitis and other "bad things."  I have assured her that her clinical exam and lab work is reassuring. She, however, requests CT scan to assess for these things.    3:14 AM CT with multiple small stones in the right kidney but no current evidence for hydronephrosis or ureteral stones. Her appendix is normal. Her abdomen remained soft and nontender on my exam. Patient's pain may or may not be related to her small stones. At this time I suggest she follow-up with her primary care physician for this. She has been given a copy of her labs and CT scan. She is to return to emergency department for worsening symptoms, high fevers, persistent vomiting or other concerns. Patient states understanding and is in agreement with the plan.    Final Clinical Impressions(s) / ED Diagnoses   Final diagnoses:  Nephrolithiasis  Acute bilateral low back pain without sciatica    New Prescriptions New Prescriptions   No medications on file     Milta Deiters 10/11/16 9675    Azalia Bilis, MD 10/11/16 [phone]

## 2016-10-11 NOTE — Discharge Instructions (Signed)
1. Medications: tylenol or ibuprofen for pain, usual home medications 2. Treatment: rest, drink plenty of fluids,  3. Follow Up: Please followup with your primary doctor in 2-3 days for discussion of your diagnoses and further evaluation after today's visit; if you do not have a primary care doctor use the resource guide provided to find one; Please return to the ER for high fevers, persistent vomiting, worsening pain or other concerns.

## 2017-06-08 NOTE — H&P (Signed)
NAMMarland Kitchen:  Earnestine MealingMITCHELL, Latiffany             ACCOUNT NO.:  [id number]  MEDICAL RECORD NO.:  [id number]  LOCATION:                                 FACILITY:  PHYSICIAN:  Juluis MireJohn S. Renley Gutman, M.D.        DATE OF BIRTH:  DATE OF ADMISSION: DATE OF DISCHARGE:                             HISTORY & PHYSICAL   DATE OF SURGERY:  Jul 01, 2017, [facility] [place]  The patient is a 22 year old, nulligravida single female, comes in for diagnostic laparoscopy.  She has been having increasing pain and discomfort, particularly with intercourse.  There have been some questions of endometriosis in the past.  Ultrasound evaluation was unremarkable.  We felt like possibly we were dealing with endometriosis associated with pelvic pain and dyspareunia.  We discussed options including agents such as Depo-Provera or __________ versus laparoscopy. The patient presents for the latter.  In terms of allergies, she is allergic to PENICILLIN.  MEDICATIONS:  She is on birth control pills.  PAST MEDICAL HISTORY:  She has had usual childhood diseases.  She has had her wisdom teeth removed.  SOCIAL HISTORY:  No tobacco.  Occasional alcohol use.  FAMILY HISTORY:  Noncontributory.  REVIEW OF SYSTEMS:  Noncontributory.  PHYSICAL EXAMINATION:  VITAL SIGNS:  The patient is afebrile.  Stable vital signs. HEENT:  The patient is normocephalic.  Pupils equal, round, and reactive to light and accommodation.  Extraocular movements are intact.  Sclerae and conjunctivae clear.  Oropharynx clear. NECK:  Without thyromegaly. BREASTS:  Not examined. LUNGS:  Clear. CARDIOVASCULAR:  Regular rhythm and rate without murmurs or gallops.  No carotid or abdominal bruits. ABDOMEN:  Benign.  No mass, organomegaly, or tenderness. PELVIC:  Normal external genitalia.  Vaginal cuff is clear.  Cervix unremarkable.  Uterus normal size, shape, and contour.  Adnexa free of mass or tenderness. EXTREMITIES:  Trace  edema. NEUROLOGIC:  Grossly within normal limits.  IMPRESSION:  Pelvic pain, dyspareunia, rule out pelvic causes such as endometriosis.  PLAN:  The patient will undergo diagnostic laparoscopy.  Risks of procedure have been explained including risk of infection.  The risk of hemorrhage that could require transfusion with the risk of AIDS or hepatitis.  Risk of injury to adjacent organs requiring exploratory surgery.  Risk of deep venous thrombosis and pulmonary embolus.  The patient expressed understanding of the indications and risks.     Juluis MireJohn S. Collie Wernick, M.D.     JSM/MEDQ  D:  06/08/2017  T:  06/08/2017  Job:  [id number]

## 2017-06-08 NOTE — H&P (Signed)
Patient name Sheri Holden, Melvina DICTATION# [id number] CSN# [id number]  [name],Maison Agrusa S, MD 06/08/2017 6:54 AM

## 2017-06-28 ENCOUNTER — Emergency Department (HOSPITAL_COMMUNITY): Payer: BLUE CROSS/BLUE SHIELD

## 2017-06-28 ENCOUNTER — Encounter (HOSPITAL_COMMUNITY): Payer: Self-pay | Admitting: Emergency Medicine

## 2017-06-28 ENCOUNTER — Emergency Department (HOSPITAL_COMMUNITY)
Admission: EM | Admit: 2017-06-28 | Discharge: 2017-06-28 | Disposition: A | Discharge status: Home / Self Care | Payer: BLUE CROSS/BLUE SHIELD | Attending: Emergency Medicine | Admitting: Emergency Medicine

## 2017-06-28 DIAGNOSIS — Z91040 Latex allergy status: Secondary | ICD-10-CM | POA: Insufficient documentation

## 2017-06-28 DIAGNOSIS — R197 Diarrhea, unspecified: Secondary | ICD-10-CM | POA: Insufficient documentation

## 2017-06-28 DIAGNOSIS — N200 Calculus of kidney: Secondary | ICD-10-CM | POA: Diagnosis not present

## 2017-06-28 DIAGNOSIS — Z79899 Other long term (current) drug therapy: Secondary | ICD-10-CM | POA: Insufficient documentation

## 2017-06-28 DIAGNOSIS — R1031 Right lower quadrant pain: Secondary | ICD-10-CM | POA: Diagnosis present

## 2017-06-28 DIAGNOSIS — R1030 Lower abdominal pain, unspecified: Secondary | ICD-10-CM

## 2017-06-28 DIAGNOSIS — R112 Nausea with vomiting, unspecified: Secondary | ICD-10-CM | POA: Diagnosis not present

## 2017-06-28 DIAGNOSIS — R109 Unspecified abdominal pain: Secondary | ICD-10-CM

## 2017-06-28 LAB — URINALYSIS, ROUTINE W REFLEX MICROSCOPIC
Bilirubin Urine: NEGATIVE
Glucose, UA: NEGATIVE mg/dL
Hgb urine dipstick: NEGATIVE
Ketones, ur: 5 mg/dL — AB
Leukocytes, UA: NEGATIVE
Nitrite: NEGATIVE
Protein, ur: NEGATIVE mg/dL
Specific Gravity, Urine: 1.026 (ref 1.005–1.030)
pH: 6 (ref 5.0–8.0)

## 2017-06-28 LAB — COMPREHENSIVE METABOLIC PANEL
ALT: 14 U/L (ref 14–54)
AST: 18 U/L (ref 15–41)
Albumin: 3.9 g/dL (ref 3.5–5.0)
Alkaline Phosphatase: 55 U/L (ref 38–126)
Anion gap: 10 (ref 5–15)
BUN: 14 mg/dL (ref 6–20)
CO2: 25 mmol/L (ref 22–32)
Calcium: 9.5 mg/dL (ref 8.9–10.3)
Chloride: 105 mmol/L (ref 101–111)
Creatinine, Ser: 0.94 mg/dL (ref 0.44–1.00)
GFR calc Af Amer: >60 mL/min (ref >60)
GFR calc non Af Amer: >60 mL/min (ref >60)
Glucose, Bld: 110 mg/dL — ABNORMAL HIGH (ref 65–99)
Potassium: 4.2 mmol/L (ref 3.5–5.1)
Sodium: 140 mmol/L (ref 135–145)
Total Bilirubin: 0.7 mg/dL (ref 0.3–1.2)
Total Protein: 7.9 g/dL (ref 6.5–8.1)

## 2017-06-28 LAB — CBC
HCT: 38.2 % (ref 36.0–46.0)
Hemoglobin: 12.8 g/dL (ref 12.0–15.0)
MCH: 31.9 pg (ref 26.0–34.0)
MCHC: 33.5 g/dL (ref 30.0–36.0)
MCV: 95.3 fL (ref 78.0–100.0)
Platelets: 305 10*3/uL (ref 150–400)
RBC: 4.01 MIL/uL (ref 3.87–5.11)
RDW: 12.2 % (ref 11.5–15.5)
WBC: 8.1 10*3/uL (ref 4.0–10.5)

## 2017-06-28 LAB — I-STAT BETA HCG BLOOD, ED (MC, WL, AP ONLY): I-stat hCG, quantitative: <5 m[IU]/mL (ref <5)

## 2017-06-28 LAB — LIPASE, BLOOD: Lipase: 23 U/L (ref 11–51)

## 2017-06-28 MED ORDER — TAMSULOSIN HCL 0.4 MG PO CAPS: 0.4000 mg | ORAL_CAPSULE | Freq: Every day | ORAL | 0 refills | Status: DC

## 2017-06-28 MED ORDER — HYDROCODONE-ACETAMINOPHEN 5-325 MG PO TABS: 1.0000 | ORAL_TABLET | Freq: Four times a day (QID) | ORAL | 0 refills | Status: AC | PRN

## 2017-06-28 MED ORDER — SODIUM CHLORIDE 0.9 % IV BOLUS
1000.0000 mL | Freq: Once | INTRAVENOUS | Status: AC
  Administered 2017-06-28: 1000 mL via INTRAVENOUS

## 2017-06-28 MED ORDER — ONDANSETRON 4 MG PO TBDP: 4.0000 mg | ORAL_TABLET | Freq: Three times a day (TID) | ORAL | 0 refills | Status: AC | PRN

## 2017-06-28 MED ORDER — KETOROLAC TROMETHAMINE 30 MG/ML IJ SOLN
15.0000 mg | Freq: Once | INTRAMUSCULAR | Status: AC
  Administered 2017-06-28: 15 mg via INTRAVENOUS
  Filled 2017-06-28: qty 1

## 2017-06-28 MED ORDER — NAPROXEN 500 MG PO TABS: 500.0000 mg | ORAL_TABLET | Freq: Two times a day (BID) | ORAL | 0 refills | Status: AC | PRN

## 2017-06-28 MED ORDER — ONDANSETRON HCL 4 MG/2ML IJ SOLN
4.0000 mg | Freq: Once | INTRAMUSCULAR | Status: AC
  Administered 2017-06-28: 4 mg via INTRAVENOUS
  Filled 2017-06-28: qty 2

## 2017-06-28 NOTE — ED Provider Notes (Signed)
[facility]-EMERGENCY DEPT Provider Note   CSN: [id number] Arrival date & time: 06/28/17  1047     History   Chief Complaint Chief Complaint  Patient presents with  . Flank Pain  . Abdominal Pain    HPI Sheri Holden is a 22 y.o. female with a PMHx of nephrolithiasis and chronic pelvic pain/dyspareunia, who presents to the ED with complaints of right flank pain, nausea, vomiting, and diarrhea that began 2 days ago.  She states this feels somewhat like when she has had prior kidney stones.  She describes the pain as 10/10 constant sharp right flank pain that radiates into the right lower quadrant and genital area, worse with laying on her right side, and with no treatments tried prior to arrival.  She has associated nausea, 2 episodes of nonbloody nonbilious emesis today, and one episode of nonbloody watery diarrhea today.  She previously felt constipated until today when she had the episode of diarrhea.  She also reports pressure dysuria.  LMP was 06/18/2017.  Of note, chart review reveals that she is under the care of Dr. Arelia Sneddon (OBGYN) for pelvic pain and dyspareunia, per his notes she's had pelvic U/S in the past which did not reveal any cause of her symptoms, and therefore she is scheduled for a diagnostic laparoscopy on 07/01/17 to evaluate for possible endometriosis.  Her PCP is at [facility].    She denies fevers, chills, CP, SOB, constipation, obstipation, melena, hematochezia, hematemesis, hematuria, increased urinary frequency, malodorous urine, vaginal bleeding/discharge, myalgias, arthralgias, numbness, tingling, focal weakness, or any other complaints at this time. Denies recent travel, sick contacts, suspicious food intake, EtOH use, NSAID use, or prior abd surgeries.   The history is provided by medical records and the patient. No language interpreter was used.  Flank Pain  Associated symptoms include abdominal pain. Pertinent negatives include  no chest pain and no shortness of breath.  Abdominal Pain   Associated symptoms include diarrhea, nausea, vomiting and dysuria (pressure). Pertinent negatives include fever, constipation, frequency, hematuria, arthralgias and myalgias.    Past Medical History:  Diagnosis Date  . Bacterial vaginosis   . Kidney stone     There are no active problems to display for this patient.   Past Surgical History:  Procedure Laterality Date  . NO PAST SURGERIES       OB History    Gravida  0   Para  0   Term  0   Preterm  0   AB  0   Living  0     SAB  0   TAB  0   Ectopic  0   Multiple  0   Live Births               Home Medications    Prior to Admission medications   Medication Sig Start Date End Date Taking? Authorizing Provider  Calcium Carbonate (CALCIUM 600 PO) Take 600 mg by mouth daily.    [provider]  JUNEL FE 1/20 1-20 MG-MCG tablet Take 1 tablet by mouth daily. 04/13/17   [provider]  OVER THE COUNTER MEDICATION Take 15,000 mg by mouth daily. Cranberry Capsule     [provider]  OVER THE COUNTER MEDICATION Take 50 mg by mouth daily. Nature's Bounty women's multivitamin with collagen    [provider]    Family History Family History  Problem Relation Age of Onset  . Alcohol abuse Neg Hx  Social History Social History   Tobacco Use  . Smoking status: Never Smoker  . Smokeless tobacco: Never Used  Substance Use Topics  . Alcohol use: No  . Drug use: No     Allergies   Penicillins and Latex   Review of Systems Review of Systems  Constitutional: Negative for chills and fever.  Respiratory: Negative for shortness of breath.   Cardiovascular: Negative for chest pain.  Gastrointestinal: Positive for abdominal pain, diarrhea, nausea and vomiting. Negative for blood in stool and constipation.  Genitourinary: Positive for dysuria (pressure) and flank pain. Negative for frequency,  hematuria, vaginal bleeding and vaginal discharge.       No malodorous urine  Musculoskeletal: Negative for arthralgias and myalgias.  Skin: Negative for color change.  Allergic/Immunologic: Negative for immunocompromised state.  Neurological: Negative for weakness and numbness.  Psychiatric/Behavioral: Negative for confusion.   All other systems reviewed and are negative for acute change except as noted in the HPI.    Physical Exam Updated Vital Signs BP (!) 143/112 (BP Location: Left Arm)   Pulse 93   Temp 98.2 F (36.8 C) (Oral)   Resp 20   Ht  (1.473 m)   Wt 57.2 kg (126 lb)   SpO2 100%   BMI 26.33 kg/m   Physical Exam  Constitutional: She is oriented to person, place, and time. Vital signs are normal. She appears well-developed and well-nourished.  Non-toxic appearance. No distress.  Afebrile, nontoxic, NAD  HENT:  Head: Normocephalic and atraumatic.  Mouth/Throat: Oropharynx is clear and moist and mucous membranes are normal.  Eyes: Conjunctivae and EOM are normal. Right eye exhibits no discharge. Left eye exhibits no discharge.  Neck: Normal range of motion. Neck supple.  Cardiovascular: Normal rate, regular rhythm, normal heart sounds and intact distal pulses. Exam reveals no gallop and no friction rub.  No murmur heard. Pulmonary/Chest: Effort normal and breath sounds normal. No respiratory distress. She has no decreased breath sounds. She has no wheezes. She has no rhonchi. She has no rales.  Abdominal: Soft. Normal appearance and bowel sounds are normal. She exhibits no distension. There is tenderness in the right upper quadrant and right lower quadrant. There is CVA tenderness. There is no rigidity, no rebound, no guarding, no tenderness at McBurney's point and negative Murphy's sign.  Soft, nondistended, +BS throughout, with diffuse R abdominal TTP mostly in the RUQ and RLQ tracking towards the R flank area, and with some milder tenderness in the epigastric area  and LLQ as well, no r/g/r, neg murphy's, no focal mcburney's point TTP, +R sided CVA TTP   Musculoskeletal: Normal range of motion.  Neurological: She is alert and oriented to person, place, and time. She has normal strength. No sensory deficit.  Skin: Skin is warm, dry and intact. No rash noted.  Psychiatric: She has a normal mood and affect.  Nursing note and vitals reviewed.    ED Treatments / Results  Labs (all labs ordered are listed, but only abnormal results are displayed) Labs Reviewed  URINALYSIS, ROUTINE W REFLEX MICROSCOPIC - Abnormal; Notable for the following components:      Result Value   APPearance HAZY (*)    Ketones, ur 5 (*)    All other components within normal limits  COMPREHENSIVE METABOLIC PANEL - Abnormal; Notable for the following components:   Glucose, Bld 110 (*)    All other components within normal limits  LIPASE, BLOOD  CBC  I-STAT BETA HCG BLOOD, ED ([facility], [facility],  [facility] ONLY)    EKG None  Radiology Ct Renal Stone Study  Result Date: 06/28/2017 CLINICAL DATA:  RIGHT flank pain radiating to the groin. History of kidney stones. EXAM: CT ABDOMEN AND PELVIS WITHOUT CONTRAST TECHNIQUE: Multidetector CT imaging of the abdomen and pelvis was performed following the standard protocol without IV contrast. COMPARISON:  CT abdomen and pelvis October 11, 2016 FINDINGS: LOWER CHEST: Lung bases are clear. The visualized heart size is normal. No pericardial effusion. HEPATOBILIARY: Normal. PANCREAS: Normal. SPLEEN: Normal. ADRENALS/URINARY TRACT: Kidneys are orthotopic, demonstrating normal size and morphology. Mild RIGHT hydroureteronephrosis to the level of the distal ureter where a 3 mm calculus is present. 2 mm residual RIGHT interpolar nephrolithiasis. Limited assessment for renal masses on this nonenhanced examination. Urinary bladder is partially distended and unremarkable. Normal adrenal glands. STOMACH/BOWEL: The stomach, small and large bowel are normal in course and  caliber without inflammatory changes, sensitivity decreased by lack of enteric contrast. Normal appendix. VASCULAR/LYMPHATIC: Aortoiliac vessels are normal in course and caliber. No lymphadenopathy by CT size criteria. REPRODUCTIVE: Normal. OTHER: No intraperitoneal free fluid or free air. MUSCULOSKELETAL: Non-acute. IMPRESSION: 1. 3 mm distal RIGHT ureteral calculus resulting in mild residual obstructive uropathy. 2 mm residual RIGHT nephrolithiasis. 2. Normal appendix. Electronically Signed   By: Awilda Metro M.D.   On: 06/28/2017 15:16    Procedures Procedures (including critical care time)  Medications Ordered in ED Medications  ketorolac (TORADOL) 30 MG/ML injection 15 mg (15 mg Intravenous Given 06/28/17 1447)  ondansetron (ZOFRAN) injection 4 mg (4 mg Intravenous Given 06/28/17 1446)  sodium chloride 0.9 % bolus 1,000 mL (1,000 mLs Intravenous New Bag/Given 06/28/17 1446)     Initial Impression / Assessment and Plan / ED Course  I have reviewed the triage vital signs and the nursing notes.  Pertinent labs & imaging results that were available during my care of the patient were reviewed by me and considered in my medical decision making (see chart for details).     22 y.o. female here with R flank pain/n/v/d x2 days radiating to RLQ and R groin area. States it feels similar to prior kidney stones. On exam, diffuse tenderness in RUQ and RLQ as well as tracking back towards the R flank, neg murphy's and no focal Mcburney's point TTP, +R CVA TTP, nonperitoneal exam. Work up thus far reveals: betaHCG neg; U/A fairly unremarkable without evidence of UTI or hematuria; lipase WNL; CMP WNL; CBC WNL. She is scheduled for diagnostic laparoscopy in 3 days for possible endometriosis, per notes from her OBGYN Dr. Arelia Sneddon, she has had pelvic U/S which did not reveal a cause of her dyspareunia and pelvic pain in the past. DDx includes kidney stone vs appendicitis (although both are low likelihood due to  labs being unremarkable and exam not being convincing) vs pelvic organ etiology. Will proceed first with CT renal study to see if this provides an answer; if not, then we may need to consider pelvic exam and further evaluation. Will give pain meds, nausea meds, fluids, and reassess shortly.   6:32 PM CT renal showing 3mm distal right ureteral calculus resulting in mild residual obstructive uropathy, with 2mm residual R nephrolithiasis; normal appendix, no other findings noted. This is likely the cause of her symptoms, doubt need for further pelvic evaluation at this time. Pt feeling much better and tolerating PO well here. Will send home with zofran, naprosyn, norco, and flomax. Urine strainer given. Advised staying hydrated. F/up with urology in 1-2wks but strict return  precautions advised. BRAT diet also advised in the event diarrhea continues, but I suspect this is just secondary to pelvic/abdominal irritation from the kidney stone. I explained the diagnosis and have given explicit precautions to return to the ER including for any other new or worsening symptoms. The patient understands and accepts the medical plan as it's been dictated and I have answered their questions. Discharge instructions concerning home care and prescriptions have been given. The patient is STABLE and is discharged to home in good condition.   [place] reviewed prior to dispensing controlled substance medications, and 2 year search was notable for: none found. Risks/benefits/alternatives and expectations discussed regarding controlled substances. Side effects of medications discussed. Informed consent obtained.    Final Clinical Impressions(s) / ED Diagnoses   Final diagnoses:  Nephrolithiasis  Right flank pain  Nausea vomiting and diarrhea  Lower abdominal pain    ED Discharge Orders        Ordered    ondansetron (ZOFRAN ODT) 4 MG disintegrating tablet  Every 8 hours PRN     06/28/17 1658    naproxen  (NAPROSYN) 500 MG tablet  2 times daily PRN     06/28/17 1658    HYDROcodone-acetaminophen (NORCO) 5-325 MG tablet  Every 6 hours PRN     06/28/17 1658    tamsulosin (FLOMAX) 0.4 MG CAPS capsule  Daily after supper     06/28/17 1658    Strain all urine     06/28/17 [address], [place], Cordelia Poche 06/28/17 1832    Raeford Razor, MD 06/29/17 1223

## 2017-06-28 NOTE — ED Triage Notes (Signed)
Patient here from home with complaints of right sided flank pain radiating around to abdomen. Hx of same. Pain 10/10.

## 2017-06-28 NOTE — ED Notes (Signed)
Patient tolerated po fluids ok. No nausea or vomiting at this time.

## 2017-06-28 NOTE — Discharge Instructions (Signed)
Your work up today reveals that you have a kidney stone, which is likely why you're having the symptoms you're having. Take naprosyn as directed as needed for pain using norco for breakthrough pain. Do not drive or operate machinery with pain medication use. May need over-the-counter stool softener with this pain medication use if constipation occurs. Use Zofran as needed for nausea. Use Flomax as directed, as this medication will help you pass the stone. Strain all urine to try to catch the stone when it passes. If you continue to have diarrhea, follow the BRAT diet outlined below to help with that. Follow-up with the urologist in the next 1 to 2 weeks for recheck of ongoing pain, however for intractable or uncontrollable symptoms at home then return to the [facility] emergency department.

## 2017-06-29 ENCOUNTER — Encounter (HOSPITAL_COMMUNITY)
Admission: RE | Admit: 2017-06-29 | Discharge: 2017-06-29 | Disposition: A | Discharge status: Home / Self Care | Payer: BLUE CROSS/BLUE SHIELD | Source: Ambulatory Visit | Attending: Obstetrics and Gynecology | Admitting: Obstetrics and Gynecology

## 2017-06-29 ENCOUNTER — Other Ambulatory Visit: Payer: Self-pay

## 2017-06-29 ENCOUNTER — Encounter (HOSPITAL_COMMUNITY): Payer: Self-pay

## 2017-06-29 DIAGNOSIS — Z01812 Encounter for preprocedural laboratory examination: Secondary | ICD-10-CM | POA: Diagnosis present

## 2017-06-29 HISTORY — DX: Family history of other specified conditions: Z84.89

## 2017-06-29 HISTORY — DX: Personal history of urinary calculi: Z87.442

## 2017-06-29 LAB — TYPE AND SCREEN
ABO/RH(D): O POS
Antibody Screen: NEGATIVE

## 2017-06-29 LAB — ABO/RH: ABO/RH(D): O POS

## 2017-06-29 NOTE — Patient Instructions (Addendum)
Your procedure is scheduled on: Friday Jul 01, 2017 at 7:30 am  Enter through the Main Entrance of [facility] at: 6:00 am  Pick up the phone at the desk and dial [phone].  Call this number if you have problems the morning of surgery: [phone].  Remember: Do NOT eat food or drink any liquids after: Midnight on Thursday May 16  Take these medicines the morning of surgery with a SIP OF WATER: Junel. Can take Norco if needed  STOP ALL VITAMINS, HERBAL MEDICATIONS AND SUPPLEMENTS 1 WEEK PRIOR TO SURGERY  DO NOT SMOKE DAY OF SURGERY  Do NOT wear jewelry (body piercing), metal hair clips/bobby pins, make-up, or nail polish. Do NOT wear lotions, powders, or perfumes.  You may wear deoderant. Do NOT shave for 48 hours prior to surgery. Do NOT bring valuables to the hospital. Contacts, dentures, or bridgework may not be worn into surgery.  Have a responsible adult drive you home and stay with you for 24 hours after your procedure

## 2017-07-15 ENCOUNTER — Encounter (HOSPITAL_BASED_OUTPATIENT_CLINIC_OR_DEPARTMENT_OTHER): Payer: Self-pay | Admitting: *Deleted

## 2017-07-18 ENCOUNTER — Encounter (HOSPITAL_BASED_OUTPATIENT_CLINIC_OR_DEPARTMENT_OTHER): Payer: Self-pay | Admitting: *Deleted

## 2017-07-18 ENCOUNTER — Other Ambulatory Visit: Payer: Self-pay

## 2017-07-18 NOTE — Progress Notes (Signed)
Spoke w/ pt via phone for pre-op interview.  Npo after mn.  Arrive at 0530.  Current cbc/diff and cmet , dated 06-28-2017, in epic and chart.  Pt getting hCG serum done Tuesday 07-18-2017 @ 0900.  May take norco/ zofran if needed am dos.

## 2017-07-19 ENCOUNTER — Encounter (HOSPITAL_COMMUNITY)
Admission: RE | Admit: 2017-07-19 | Discharge: 2017-07-19 | Disposition: A | Discharge status: Home / Self Care | Payer: BLUE CROSS/BLUE SHIELD | Source: Ambulatory Visit | Attending: Obstetrics and Gynecology | Admitting: Obstetrics and Gynecology

## 2017-07-19 DIAGNOSIS — N941 Unspecified dyspareunia: Secondary | ICD-10-CM | POA: Diagnosis not present

## 2017-07-19 DIAGNOSIS — R102 Pelvic and perineal pain: Secondary | ICD-10-CM | POA: Diagnosis not present

## 2017-07-19 DIAGNOSIS — Z01818 Encounter for other preprocedural examination: Secondary | ICD-10-CM | POA: Diagnosis present

## 2017-07-19 LAB — HCG, SERUM, QUALITATIVE: Preg, Serum: NEGATIVE

## 2017-07-21 NOTE — Anesthesia Preprocedure Evaluation (Addendum)
Anesthesia Evaluation  Patient identified by MRN, date of birth, ID band Patient awake    Reviewed: Allergy & Precautions, NPO status , Patient's Chart, lab work & pertinent test results  History of Anesthesia Complications Negative for: history of anesthetic complications  Airway Mallampati: I  TM Distance: >3 FB Neck ROM: Full    Dental no notable dental hx. (+) Dental Advisory Given   Pulmonary neg pulmonary ROS,    Pulmonary exam normal        Cardiovascular negative cardio ROS Normal cardiovascular exam     Neuro/Psych negative neurological ROS  negative psych ROS   GI/Hepatic negative GI ROS, Neg liver ROS,   Endo/Other  negative endocrine ROS  Renal/GU negative Renal ROS     Musculoskeletal negative musculoskeletal ROS (+)   Abdominal   Peds  Hematology negative hematology ROS (+)   Anesthesia Other Findings Day of surgery medications reviewed with the patient.  Reproductive/Obstetrics                            Anesthesia Physical Anesthesia Plan  ASA: I  Anesthesia Plan: General   Post-op Pain Management:    Induction: Intravenous  PONV Risk Score and Plan: 4 or greater and Ondansetron, Dexamethasone, Scopolamine patch - Pre-op and Diphenhydramine  Airway Management Planned: Oral ETT  Additional Equipment:   Intra-op Plan:   Post-operative Plan: Extubation in OR  Informed Consent: I have reviewed the patients History and Physical, chart, labs and discussed the procedure including the risks, benefits and alternatives for the proposed anesthesia with the patient or authorized representative who has indicated his/her understanding and acceptance.   Dental advisory given  Plan Discussed with: Anesthesiologist  Anesthesia Plan Comments:        Anesthesia Quick Evaluation

## 2017-07-22 ENCOUNTER — Encounter (HOSPITAL_BASED_OUTPATIENT_CLINIC_OR_DEPARTMENT_OTHER): Payer: Self-pay

## 2017-07-22 ENCOUNTER — Ambulatory Visit (HOSPITAL_BASED_OUTPATIENT_CLINIC_OR_DEPARTMENT_OTHER): Payer: BLUE CROSS/BLUE SHIELD | Admitting: Anesthesiology

## 2017-07-22 ENCOUNTER — Encounter (HOSPITAL_BASED_OUTPATIENT_CLINIC_OR_DEPARTMENT_OTHER)
Admission: RE | Disposition: A | Discharge status: Home / Self Care | Payer: Self-pay | Source: Ambulatory Visit | Attending: Obstetrics and Gynecology

## 2017-07-22 ENCOUNTER — Ambulatory Visit (HOSPITAL_BASED_OUTPATIENT_CLINIC_OR_DEPARTMENT_OTHER)
Admission: RE | Admit: 2017-07-22 | Discharge: 2017-07-22 | Disposition: A | Discharge status: Home / Self Care | Payer: BLUE CROSS/BLUE SHIELD | Source: Ambulatory Visit | Attending: Obstetrics and Gynecology | Admitting: Obstetrics and Gynecology

## 2017-07-22 DIAGNOSIS — Z793 Long term (current) use of hormonal contraceptives: Secondary | ICD-10-CM | POA: Diagnosis not present

## 2017-07-22 DIAGNOSIS — N803 Endometriosis of pelvic peritoneum: Secondary | ICD-10-CM | POA: Diagnosis not present

## 2017-07-22 DIAGNOSIS — N941 Unspecified dyspareunia: Secondary | ICD-10-CM | POA: Diagnosis present

## 2017-07-22 DIAGNOSIS — Z880 Allergy status to penicillin: Secondary | ICD-10-CM | POA: Insufficient documentation

## 2017-07-22 DIAGNOSIS — R102 Pelvic and perineal pain: Secondary | ICD-10-CM | POA: Diagnosis present

## 2017-07-22 DIAGNOSIS — N809 Endometriosis, unspecified: Secondary | ICD-10-CM

## 2017-07-22 HISTORY — DX: Calculus of kidney: N20.0

## 2017-07-22 HISTORY — PX: LAPAROSCOPY: SHX197

## 2017-07-22 HISTORY — DX: Pelvic and perineal pain: R10.2

## 2017-07-22 SURGERY — LAPAROSCOPY, DIAGNOSTIC
Anesthesia: General

## 2017-07-22 MED ORDER — ROCURONIUM BROMIDE 10 MG/ML (PF) SYRINGE
PREFILLED_SYRINGE | INTRAVENOUS | Status: DC | PRN
Start: 2017-07-22 — End: 2017-07-22
  Administered 2017-07-22: 40 mg via INTRAVENOUS

## 2017-07-22 MED ORDER — SCOPOLAMINE 1 MG/3DAYS TD PT72
1.0000 | MEDICATED_PATCH | TRANSDERMAL | Status: DC
  Administered 2017-07-22: 1.5 mg via TRANSDERMAL
  Filled 2017-07-22: qty 1

## 2017-07-22 MED ORDER — SUGAMMADEX SODIUM 200 MG/2ML IV SOLN
INTRAVENOUS | Status: AC
  Filled 2017-07-22: qty 2

## 2017-07-22 MED ORDER — MIDAZOLAM HCL 2 MG/2ML IJ SOLN
INTRAMUSCULAR | Status: DC | PRN
  Administered 2017-07-22: 2 mg via INTRAVENOUS

## 2017-07-22 MED ORDER — FENTANYL CITRATE (PF) 100 MCG/2ML IJ SOLN
INTRAMUSCULAR | Status: DC | PRN
  Administered 2017-07-22: 100 ug via INTRAVENOUS

## 2017-07-22 MED ORDER — LIDOCAINE 2% (20 MG/ML) 5 ML SYRINGE
INTRAMUSCULAR | Status: DC | PRN
  Administered 2017-07-22: 100 mg via INTRAVENOUS

## 2017-07-22 MED ORDER — MIDAZOLAM HCL 2 MG/2ML IJ SOLN
INTRAMUSCULAR | Status: AC
  Filled 2017-07-22: qty 2

## 2017-07-22 MED ORDER — ONDANSETRON HCL 4 MG/2ML IJ SOLN
INTRAMUSCULAR | Status: DC | PRN
  Administered 2017-07-22: 4 mg via INTRAVENOUS

## 2017-07-22 MED ORDER — KETOROLAC TROMETHAMINE 30 MG/ML IJ SOLN
INTRAMUSCULAR | Status: DC | PRN
  Administered 2017-07-22: 30 mg via INTRAVENOUS

## 2017-07-22 MED ORDER — DEXAMETHASONE SODIUM PHOSPHATE 10 MG/ML IJ SOLN
INTRAMUSCULAR | Status: AC
Start: 2017-07-22 — End: ?
  Filled 2017-07-22: qty 1

## 2017-07-22 MED ORDER — PROPOFOL 10 MG/ML IV BOLUS
INTRAVENOUS | Status: AC
  Filled 2017-07-22: qty 40

## 2017-07-22 MED ORDER — ONDANSETRON HCL 4 MG/2ML IJ SOLN
INTRAMUSCULAR | Status: AC
  Filled 2017-07-22: qty 2

## 2017-07-22 MED ORDER — FENTANYL CITRATE (PF) 100 MCG/2ML IJ SOLN
25.0000 ug | INTRAMUSCULAR | Status: DC | PRN
  Filled 2017-07-22: qty 1

## 2017-07-22 MED ORDER — ROCURONIUM BROMIDE 10 MG/ML (PF) SYRINGE
PREFILLED_SYRINGE | INTRAVENOUS | Status: AC
  Filled 2017-07-22: qty 5

## 2017-07-22 MED ORDER — ARTIFICIAL TEARS OPHTHALMIC OINT
TOPICAL_OINTMENT | OPHTHALMIC | Status: AC
  Filled 2017-07-22: qty 3.5

## 2017-07-22 MED ORDER — SUGAMMADEX SODIUM 200 MG/2ML IV SOLN
INTRAVENOUS | Status: AC
  Filled 2017-07-22: qty 6

## 2017-07-22 MED ORDER — DEXAMETHASONE SODIUM PHOSPHATE 10 MG/ML IJ SOLN
INTRAMUSCULAR | Status: AC
  Filled 2017-07-22: qty 1

## 2017-07-22 MED ORDER — OXYCODONE HCL 5 MG PO TABS
ORAL_TABLET | ORAL | Status: AC
  Filled 2017-07-22: qty 1

## 2017-07-22 MED ORDER — LACTATED RINGERS IV SOLN
INTRAVENOUS | Status: DC
  Administered 2017-07-22 (×2): via INTRAVENOUS
  Filled 2017-07-22: qty 1000

## 2017-07-22 MED ORDER — DEXAMETHASONE SODIUM PHOSPHATE 10 MG/ML IJ SOLN
INTRAMUSCULAR | Status: DC | PRN
  Administered 2017-07-22: 10 mg via INTRAVENOUS

## 2017-07-22 MED ORDER — KETOROLAC TROMETHAMINE 30 MG/ML IJ SOLN
INTRAMUSCULAR | Status: AC
  Filled 2017-07-22: qty 1

## 2017-07-22 MED ORDER — PROPOFOL 10 MG/ML IV BOLUS
INTRAVENOUS | Status: DC | PRN
  Administered 2017-07-22: 200 mg via INTRAVENOUS

## 2017-07-22 MED ORDER — BUPIVACAINE HCL 0.25 % IJ SOLN
INTRAMUSCULAR | Status: DC | PRN
  Administered 2017-07-22: 6 mL

## 2017-07-22 MED ORDER — OXYCODONE HCL 5 MG PO TABS
5.0000 mg | ORAL_TABLET | Freq: Once | ORAL | Status: AC
  Administered 2017-07-22: 5 mg via ORAL
  Filled 2017-07-22: qty 1

## 2017-07-22 MED ORDER — SCOPOLAMINE 1 MG/3DAYS TD PT72
MEDICATED_PATCH | TRANSDERMAL | Status: AC
  Filled 2017-07-22: qty 1

## 2017-07-22 MED ORDER — LIDOCAINE 2% (20 MG/ML) 5 ML SYRINGE
INTRAMUSCULAR | Status: AC
  Filled 2017-07-22: qty 5

## 2017-07-22 MED ORDER — FENTANYL CITRATE (PF) 100 MCG/2ML IJ SOLN
INTRAMUSCULAR | Status: AC
  Filled 2017-07-22: qty 2

## 2017-07-22 MED ORDER — PROMETHAZINE HCL 25 MG/ML IJ SOLN
6.2500 mg | INTRAMUSCULAR | Status: DC | PRN
  Filled 2017-07-22: qty 1

## 2017-07-22 MED ORDER — SUGAMMADEX SODIUM 200 MG/2ML IV SOLN
INTRAVENOUS | Status: DC | PRN
  Administered 2017-07-22: 200 mg via INTRAVENOUS

## 2017-07-22 SURGICAL SUPPLY — 37 items
APPLICATOR COTTON TIP 6IN STRL (MISCELLANEOUS) IMPLANT
CANISTER SUCT 3000ML PPV (MISCELLANEOUS) ×1 IMPLANT
CANISTER SUCTION 1200CC (MISCELLANEOUS) IMPLANT
CATH ROBINSON RED A/P 16FR (CATHETERS) ×1 IMPLANT
COVER MAYO STAND STRL (DRAPES) ×2 IMPLANT
DERMABOND ADVANCED (GAUZE/BANDAGES/DRESSINGS) ×2
DERMABOND ADVANCED .7 DNX12 (GAUZE/BANDAGES/DRESSINGS) ×1 IMPLANT
DRSG COVADERM PLUS 2X2 (GAUZE/BANDAGES/DRESSINGS) IMPLANT
DURAPREP 26ML APPLICATOR (WOUND CARE) ×2 IMPLANT
GLOVE BIO SURGEON STRL SZ7 (GLOVE) ×2 IMPLANT
GOWN STRL REUS W/TWL XL LVL3 (GOWN DISPOSABLE) ×3 IMPLANT
IV NS IRRIG 3000ML ARTHROMATIC (IV SOLUTION) ×1 IMPLANT
KIT TURNOVER CYSTO (KITS) ×2 IMPLANT
NDL INSUFFLATION 14GA 120MM (NEEDLE) IMPLANT
NEEDLE INSUFFLATION 14GA 120MM (NEEDLE) ×2 IMPLANT
NS IRRIG 500ML POUR BTL (IV SOLUTION) ×2 IMPLANT
PACK LAPAROSCOPY BASIN (CUSTOM PROCEDURE TRAY) ×2 IMPLANT
PAD OB MATERNITY 4.3X12.25 (PERSONAL CARE ITEMS) ×2 IMPLANT
PAD PREP 24X48 CUFFED NSTRL (MISCELLANEOUS) ×2 IMPLANT
SCISSORS LAP 5X45 EPIX DISP (ENDOMECHANICALS) IMPLANT
SEALER TISSUE G2 CVD JAW 45CM (ENDOMECHANICALS) IMPLANT
SET IRRIG TUBING LAPAROSCOPIC (IRRIGATION / IRRIGATOR) ×1 IMPLANT
SET IRRIG Y TYPE TUR BLADDER L (SET/KITS/TRAYS/PACK) IMPLANT
SUT VIC AB 3-0 PS2 18 (SUTURE) ×1
SUT VIC AB 3-0 PS2 18XBRD (SUTURE) ×1 IMPLANT
SUT VICRYL 0 ENDOLOOP (SUTURE) IMPLANT
SUT VICRYL 0 UR6 27IN ABS (SUTURE) IMPLANT
SUT VICRYL 4-0 PS2 18IN ABS (SUTURE) IMPLANT
SYS BAG RETRIEVAL 10MM (BASKET)
SYSTEM BAG RETRIEVAL 10MM (BASKET) IMPLANT
TOWEL OR 17X24 6PK STRL BLUE (TOWEL DISPOSABLE) ×4 IMPLANT
TROCAR BALLN 12MMX100 BLUNT (TROCAR) ×1 IMPLANT
TROCAR OPTI TIP 5M 100M (ENDOMECHANICALS) ×2 IMPLANT
TROCAR XCEL DIL TIP R 11M (ENDOMECHANICALS) ×1 IMPLANT
TUBING INSUF HEATED (TUBING) ×2 IMPLANT
WARMER LAPAROSCOPE (MISCELLANEOUS) ×2 IMPLANT
WATER STERILE IRR 500ML POUR (IV SOLUTION) ×1 IMPLANT

## 2017-07-22 NOTE — Anesthesia Postprocedure Evaluation (Signed)
Anesthesia Post Note  Patient: Sheri Holden  Procedure(s) Performed: LAPAROSCOPY DIAGNOSTIC (N/A )     Anesthesia Post Evaluation  Last Vitals:  Vitals:   07/22/17 0845 07/22/17 0900  BP: 114/74 106/80  Pulse: 72 71  Resp: 13 15  Temp:    SpO2: 98% 97%    Last Pain:  Vitals:   07/22/17 0845  TempSrc:   PainSc: 0-No pain                 Ferne Ellingwood DANIEL

## 2017-07-22 NOTE — Anesthesia Procedure Notes (Signed)
Procedure Name: Intubation Date/Time: 07/22/2017 7:35 AM Performed by: Wanita Chamberlain, CRNA Pre-anesthesia Checklist: Patient being monitored, Suction available, Emergency Drugs available, Patient identified and Timeout performed Patient Re-evaluated:Patient Re-evaluated prior to induction Oxygen Delivery Method: Circle system utilized Preoxygenation: Pre-oxygenation with 100% oxygen Induction Type: IV induction Ventilation: Mask ventilation without difficulty Laryngoscope Size: Mac and 3 Grade View: Grade I Tube type: Oral Tube size: 7.0 mm Number of attempts: 1 Airway Equipment and Method: Stylet Placement Confirmation: CO2 detector,  positive ETCO2,  ETT inserted through vocal cords under direct vision and breath sounds checked- equal and bilateral Secured at: 21 cm Tube secured with: Tape Dental Injury: Teeth and Oropharynx as per pre-operative assessment

## 2017-07-22 NOTE — Transfer of Care (Signed)
Immediate Anesthesia Transfer of Care Note  Patient: Sheri Holden  Procedure(s) Performed: LAPAROSCOPY DIAGNOSTIC (N/A )  Patient Location: PACU  Anesthesia Type:General  Level of Consciousness: awake, alert , oriented and patient cooperative  Airway & Oxygen Therapy: Patient Spontanous Breathing and Patient connected to nasal cannula oxygen  Post-op Assessment: Report given to RN and Post -op Vital signs reviewed and stable  Post vital signs: Reviewed and stable  Last Vitals:  Vitals Value Taken Time  BP 116/72 07/22/2017  8:17 AM  Temp    Pulse 81 07/22/2017  8:18 AM  Resp 10 07/22/2017  8:18 AM  SpO2 99 % 07/22/2017  8:18 AM  Vitals shown include unvalidated device data.  Last Pain:  Vitals:   07/22/17 0614  TempSrc:   PainSc: 0-No pain      Patients Stated Pain Goal: 3 (07/22/17 [id number])  Complications: No apparent anesthesia complications

## 2017-07-22 NOTE — H&P (Signed)
  History and physical exam unchanged 

## 2017-07-22 NOTE — Discharge Instructions (Signed)
DISCHARGE INSTRUCTIONS: Laparoscopy ° °The following instructions have been prepared to help you care for yourself upon your return home today. ° °Wound care: °• Do not get the incision wet for the first 24 hours. The incision should be kept clean and dry. °• The Band-Aids or dressings may be removed the day after surgery. °• Should the incision become sore, red, and swollen after the first week, check with your doctor. ° °Personal hygiene: °• Shower the day after your procedure. ° °Activity and limitations: °• Do NOT drive or operate any equipment today. °• Do NOT lift anything more than 15 pounds for 2-3 weeks after surgery. °• Do NOT rest in bed all day. °• Walking is encouraged. Walk each day, starting slowly with 5-minute walks 3 or 4 times a day. Slowly increase the length of your walks. °• Walk up and down stairs slowly. °• Do NOT do strenuous activities, such as golfing, playing tennis, bowling, running, biking, weight lifting, gardening, mowing, or vacuuming for 2-4 weeks. Ask your doctor when it is okay to start. ° °Diet: Eat a light meal as desired this evening. You may resume your usual diet tomorrow. ° °Return to work: This is dependent on the type of work you do. For the most part you can return to a desk job within a week of surgery. If you are more active at work, please discuss this with your doctor. ° °What to expect after your surgery: You may have a slight burning sensation when you urinate on the first day. You may have a very small amount of blood in the urine. Expect to have a small amount of vaginal discharge/light bleeding for 1-2 weeks. It is not unusual to have abdominal soreness and bruising for up to 2 weeks. You may be tired and need more rest for about 1 week. You may experience shoulder pain for 24-72 hours. Lying flat in bed may relieve it. ° °Call your doctor for any of the following: °• Develop a fever of 100.4 or greater °• Inability to urinate 6 hours after discharge from  hospital °• Severe pain not relieved by pain medications °• Persistent of heavy bleeding at incision site °• Redness or swelling around incision site after a week °• Increasing nausea or vomiting ° ° °Post Anesthesia Home Care Instructions ° °Activity: °Get plenty of rest for the remainder of the day. A responsible individual must stay with you for 24 hours following the procedure.  °For the next 24 hours, DO NOT: °-Drive a car °-Operate machinery °-Drink alcoholic beverages °-Take any medication unless instructed by your physician °-Make any legal decisions or sign important papers. ° °Meals: °Start with liquid foods such as gelatin or soup. Progress to regular foods as tolerated. Avoid greasy, spicy, heavy foods. If nausea and/or vomiting occur, drink only clear liquids until the nausea and/or vomiting subsides. Call your physician if vomiting continues. ° °Special Instructions/Symptoms: °Your throat may feel dry or sore from the anesthesia or the breathing tube placed in your throat during surgery. If this causes discomfort, gargle with warm salt water. The discomfort should disappear within 24 hours. ° °If you had a scopolamine patch placed behind your ear for the management of post- operative nausea and/or vomiting: ° °1. The medication in the patch is effective for 72 hours, after which it should be removed.  Wrap patch in a tissue and discard in the trash. Wash hands thoroughly with soap and water. °2. You may remove the patch earlier than 72   hours if you experience unpleasant side effects which may include dry mouth, dizziness or visual disturbances. 3. Avoid touching the patch. Wash your hands with soap and water after contact with the patch.  May take Percocet at 4 PM.

## 2017-07-22 NOTE — Brief Op Note (Signed)
07/22/2017  8:10 AM  PATIENT:  Hartley Barefootionna D Lance  22 y.o. female  PRE-OPERATIVE DIAGNOSIS:  pelvic pain  POST-OPERATIVE DIAGNOSIS:  pelvic pain  PROCEDURE:  Procedure(s): LAPAROSCOPY DIAGNOSTIC (N/A)  SURGEON:  Surgeon(s) and Role:    * Richardean ChimeraMcComb, Owyn Raulston, MD - Primary  PHYSICIAN ASSISTANT:   ASSISTANTS: none   ANESTHESIA:   local and general  EBL: minimal  BLOOD ADMINISTERED:none  DRAINS: none   LOCAL MEDICATIONS USED:  XYLOCAINE   SPECIMEN:  No Specimen  DISPOSITION OF SPECIMEN:  N/A  COUNTS:  YES  TOURNIQUET:  * No tourniquets in log *  DICTATION: .Other Dictation: Dictation Number [id number]  PLAN OF CARE: Discharge to home after PACU  PATIENT DISPOSITION:  PACU - hemodynamically stable.   Delay start of Pharmacological VTE agent (>24hrs) due to surgical blood loss or risk of bleeding: not applicable

## 2017-07-22 NOTE — Op Note (Signed)
NAME: Sheri Holden, [name] MEDICAL RECORD ZO:[id number] ACCOUNT [id number] DATE OF BIRTH:[birth year] FACILITY: [facility] LOCATION: [facility] PHYSICIAN:Timeka Goette [name] Ericca Labra, MD  OPERATIVE REPORT  DATE OF PROCEDURE:  07/22/2017  PREOPERATIVE DIAGNOSIS:  Pelvic pain.  POSTOPERATIVE DIAGNOSES:  Pelvic pain  with finding of minimal pelvic endometriosis.  PROCEDURE:  Diagnostic laparoscopy, cautery of endometriotic implants.  SURGEON:  Richardean ChimeraJohn Nikya Busler, MD  ANESTHESIA:  General.  ESTIMATED BLOOD LOSS:  Minimal.  PACKS AND DRAINS:  None   INTRAOPERATIVE BLOOD REPLACED:  None.  SPECIMENS:  None.  INDICATIONS:  Please see history and physical.  DESCRIPTION OF PROCEDURE:  The patient was taken to the OR and placed in supine position.  After satisfactory level of general endotracheal anesthesia was obtained, the patient was placed in the dorsal lithotomy position using the Allen stirrups.   Perineum and vagina were prepped out with Betadine.  We did a close evaluation of the vaginal area.  There was no evidence of any rectovaginal fistula to my exam.  At this point in time,  bladder was entered via catheterization.  Cervix was grasped with  single tooth tenaculum.  Hulka tenaculum was then put in place.  The speculum and single tooth tenaculum were then removed.  Abdomen was prepped with DuraPrep.  After a time of setting up, the patient was draped in a sterile field.  A subumbilical  incision was made with a knife.  The Veress needle was introduced into the abdominal cavity.  Abdomen was inflated with approximately 3 liters of carbon dioxide.  The operative laparoscope was introduced.  Visualization revealed no evidence of injury to  adjacent organs.  Upper abdomen including liver were clear.  Both lateral gutters were clear.  Appendix was unremarkable.  The 5 mm trocar was put in place under direct visualization in the suprapubic area.  Uterus was then manipulated.  She had some  minimal implants of  endometriosis along the left and right pelvic sidewall and in the cul-de-sac.  We brought in the suction device.  We irrigated the pelvis, removing the irrigation.  We then brought in the bipolar.  The areas of endometriosis were  cauterized completely.  There was no evidence of injury to adjacent organs.  Abdomen was irrigated and the irrigation was removed.  At this point in time, the procedure was completed.  All trocars were removed.  The abdomen had been de-inflated with  carbon dioxide.  Subumbilical incision closed with interrupted subcuticular suture of 4-0 Vicryl.  Suprapubic incisions were closed with Dermabond.  The Hulka tenaculum was then removed.  The patient was taken out of the dorsal lithotomy position.    Once alert and extubated, transferred to recovery in good condition.  Sponge, instrument and needle counts were correct by circulating nurse x2.  AN/NUANCE  D:07/22/2017 T:07/22/2017 JOB:[id number]

## 2017-07-25 ENCOUNTER — Encounter (HOSPITAL_BASED_OUTPATIENT_CLINIC_OR_DEPARTMENT_OTHER): Payer: Self-pay | Admitting: Obstetrics and Gynecology

## 2017-07-29 NOTE — Addendum Note (Signed)
Addendum  created 07/29/17 0803 by Tyrone NineSauve, Joneric Streight F, CRNA   Intraprocedure Meds edited

## 2019-03-13 IMAGING — CT CT RENAL STONE PROTOCOL
2 of 4 series · 17 of 46 positions shown, 19 images · non-contrast
Comparison: CT abdomen and pelvis October 11, 2016

CLINICAL DATA: RIGHT flank pain radiating to the groin. History of
kidney stones.

EXAM:
CT ABDOMEN AND PELVIS WITHOUT CONTRAST
TECHNIQUE: Multidetector CT imaging of the abdomen and pelvis was performed
following the standard protocol without IV contrast.

[Series 2: axial st · axial · 0.62mm/px · z∈[-388,-38]mm · 14 of 80 slices shown, 16 images]
[im 5/80  soft-tissue]
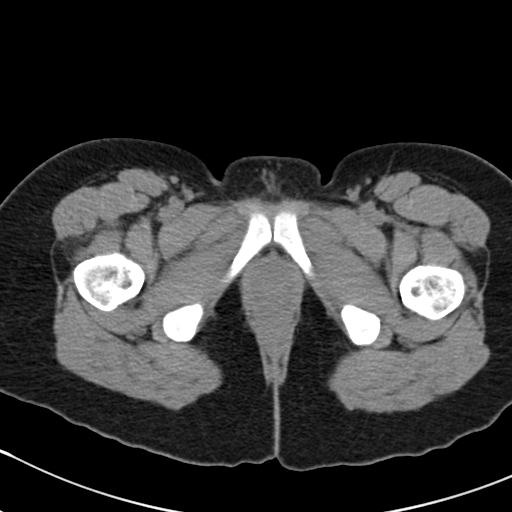
[im 5/80  bone]
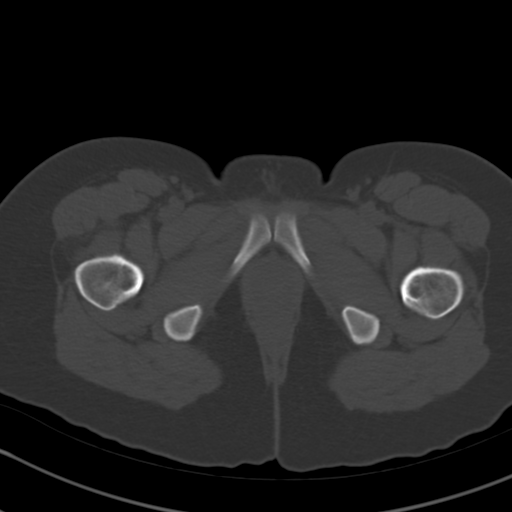
[im 9/80  soft-tissue]
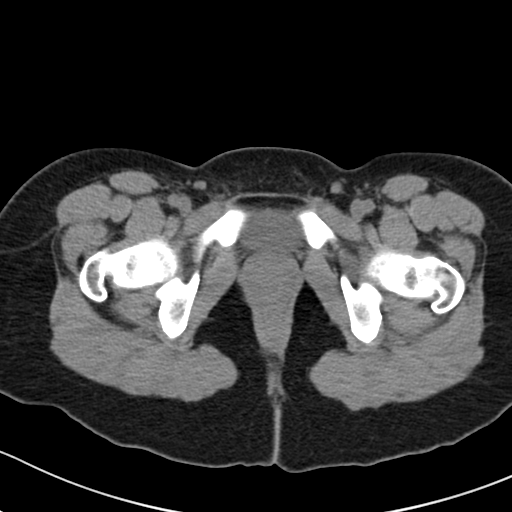
[im 17/80  soft-tissue]
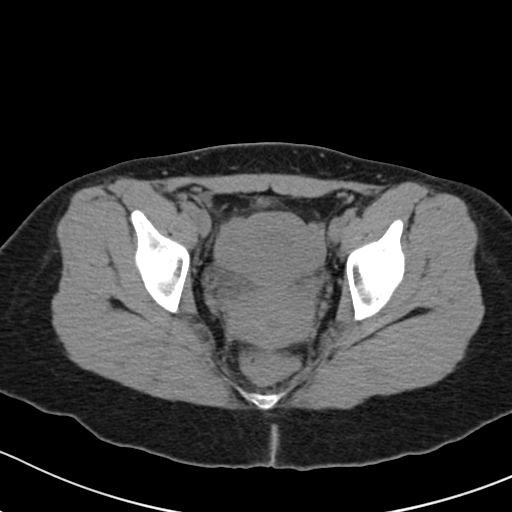
[im 21/80  soft-tissue]
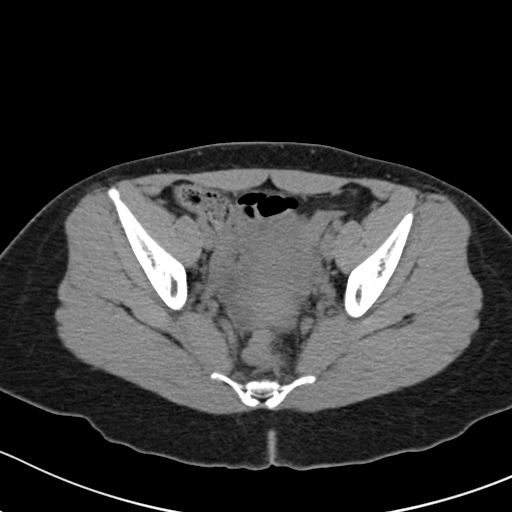
[im 25/80  soft-tissue]
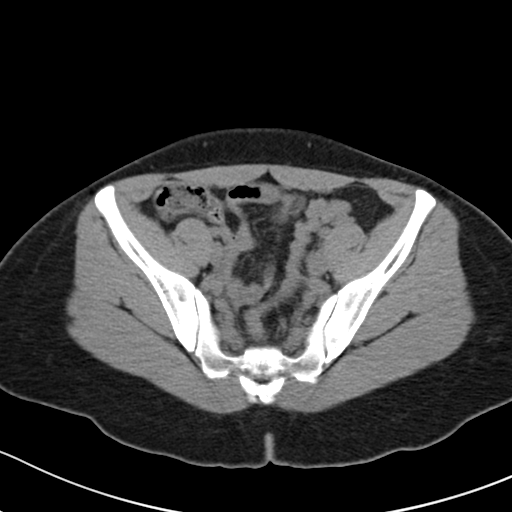
[im 34/80  soft-tissue]
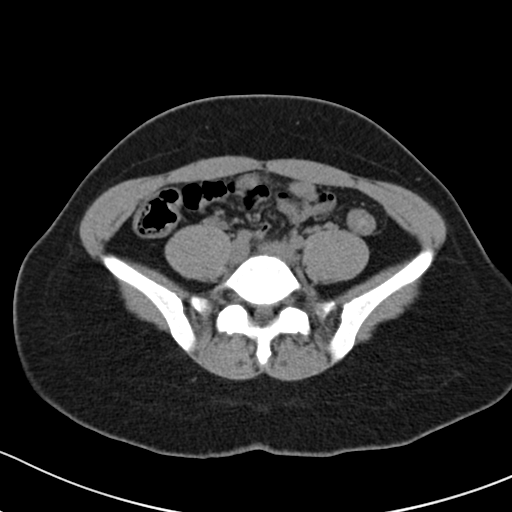
[im 38/80  soft-tissue]
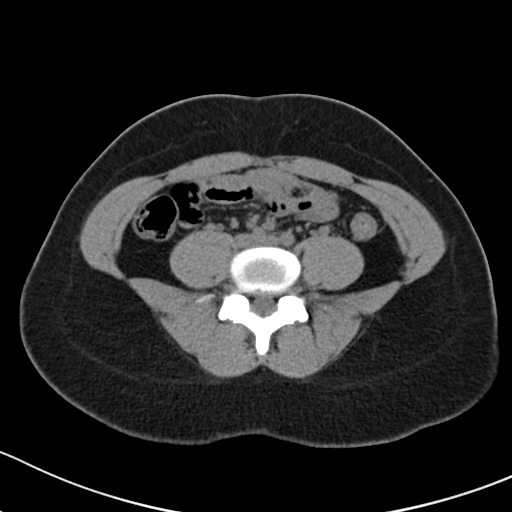
[im 42/80  soft-tissue]
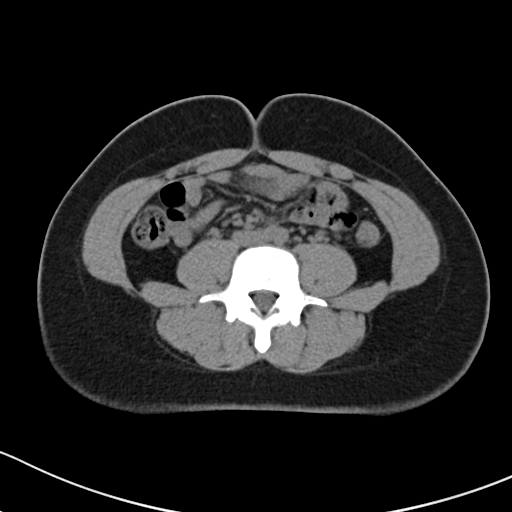
[im 46/80  soft-tissue]
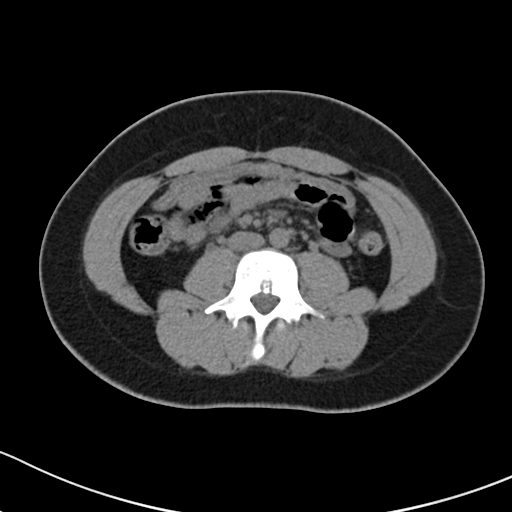
[im 46/80  bone]
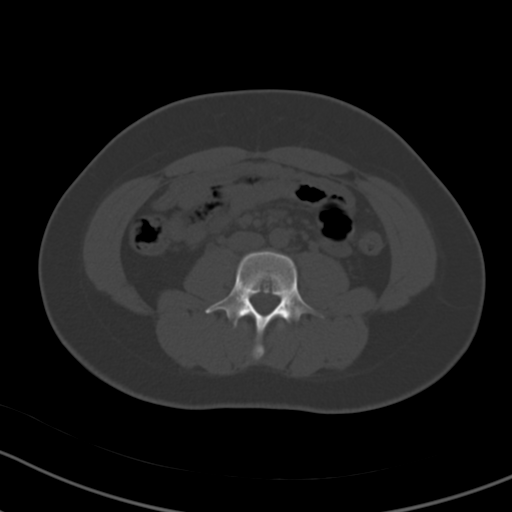
[im 55/80  soft-tissue]
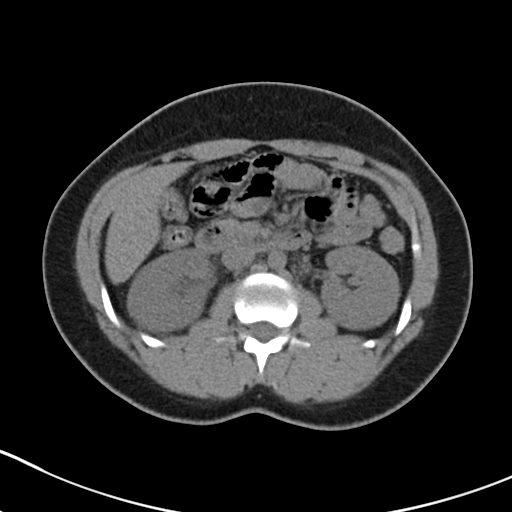
[im 59/80  soft-tissue]
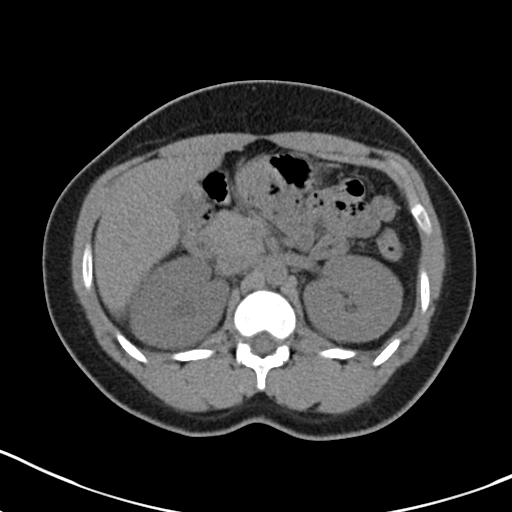
[im 63/80  soft-tissue]
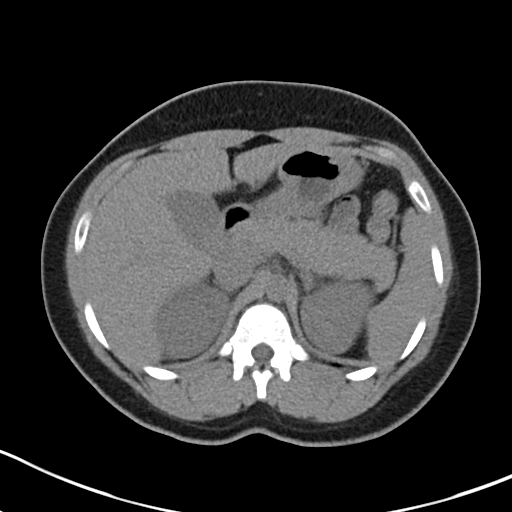
[im 71/80  soft-tissue]
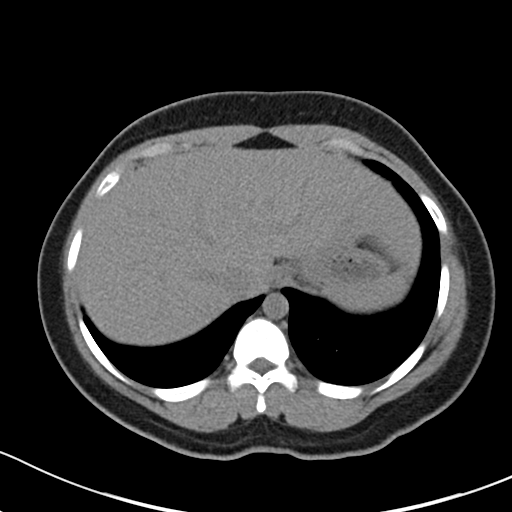
[im 75/80  soft-tissue]
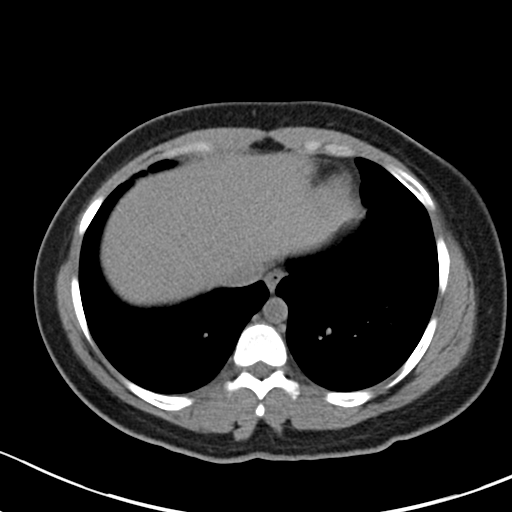

[Series 4: coronal · coronal · 0.88mm/px · 3 of 91 slices shown]
[im 31/91  soft-tissue]
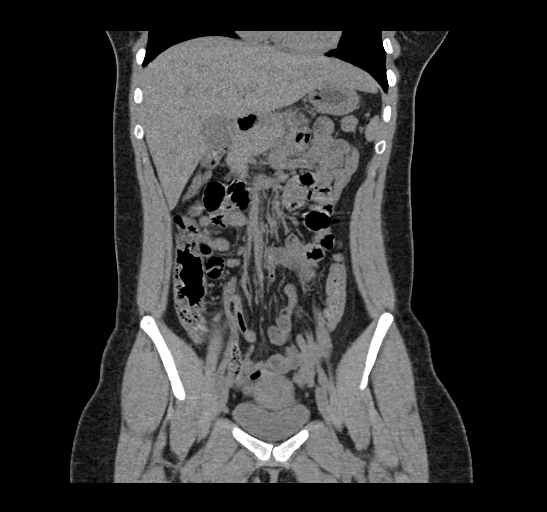
[im 41/91  soft-tissue]
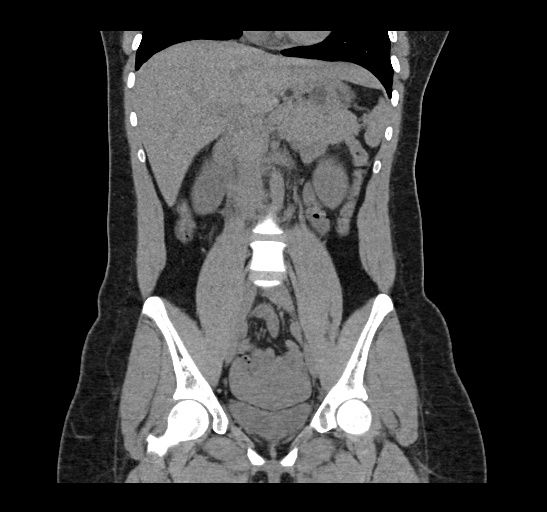
[im 51/91  soft-tissue]
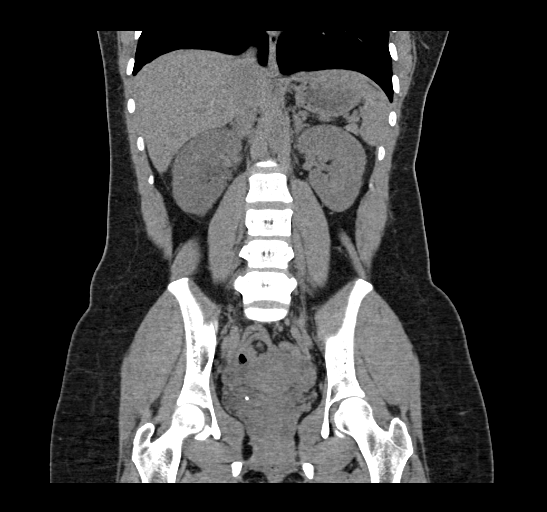

[17 of 46 positions shown; findings below may reference images not displayed]

FINDINGS: LOWER CHEST: Lung bases are clear. The visualized heart size is
normal. No pericardial effusion.

HEPATOBILIARY: Normal.

PANCREAS: Normal.

SPLEEN: Normal.

ADRENALS/URINARY TRACT: Kidneys are orthotopic, demonstrating normal
size and morphology. Mild RIGHT hydroureteronephrosis to the level
of the distal ureter where a 3 mm calculus is present. 2 mm residual
RIGHT interpolar nephrolithiasis. Limited assessment for renal
masses on this nonenhanced examination. Urinary bladder is partially
distended and unremarkable. Normal adrenal glands.

STOMACH/BOWEL: The stomach, small and large bowel are normal in
course and caliber without inflammatory changes, sensitivity
decreased by lack of enteric contrast. Normal appendix.

VASCULAR/LYMPHATIC: Aortoiliac vessels are normal in course and
caliber. No lymphadenopathy by CT size criteria.

REPRODUCTIVE: Normal.

OTHER: No intraperitoneal free fluid or free air.

MUSCULOSKELETAL: Non-acute.
IMPRESSION: 1. 3 mm distal RIGHT ureteral calculus resulting in mild residual
obstructive uropathy. 2 mm residual RIGHT nephrolithiasis.
2. Normal appendix.
